# Patient Record
Sex: Male | Born: 1955 | Race: White | Hispanic: No | Marital: Single | Smoking: Former smoker
Health system: Southern US, Community
[De-identification: ages and names within clinical notes are randomized; demographics above are authoritative.]

## PROBLEM LIST (undated history)

## (undated) DIAGNOSIS — I2510 Atherosclerotic heart disease of native coronary artery without angina pectoris: Secondary | ICD-10-CM

## (undated) DIAGNOSIS — J9610 Chronic respiratory failure, unspecified whether with hypoxia or hypercapnia: Secondary | ICD-10-CM

## (undated) DIAGNOSIS — I10 Essential (primary) hypertension: Secondary | ICD-10-CM

## (undated) DIAGNOSIS — R079 Chest pain, unspecified: Secondary | ICD-10-CM

## (undated) DIAGNOSIS — G8929 Other chronic pain: Secondary | ICD-10-CM

## (undated) DIAGNOSIS — E119 Type 2 diabetes mellitus without complications: Secondary | ICD-10-CM

## (undated) HISTORY — PX: SPINE SURGERY: SHX786

---

## 2009-04-19 ENCOUNTER — Inpatient Hospital Stay: Payer: Self-pay | Admitting: Internal Medicine

## 2009-04-29 ENCOUNTER — Ambulatory Visit: Payer: Self-pay | Admitting: Internal Medicine

## 2011-03-14 ENCOUNTER — Other Ambulatory Visit: Payer: Self-pay | Admitting: Neurosurgery

## 2011-03-14 DIAGNOSIS — M549 Dorsalgia, unspecified: Secondary | ICD-10-CM

## 2011-03-21 ENCOUNTER — Ambulatory Visit
Admission: RE | Admit: 2011-03-21 | Discharge: 2011-03-21 | Disposition: A | Discharge status: Home / Self Care | Payer: Commercial Indemnity | Source: Ambulatory Visit | Attending: Neurosurgery | Admitting: Neurosurgery

## 2011-03-21 DIAGNOSIS — M549 Dorsalgia, unspecified: Secondary | ICD-10-CM

## 2011-11-16 ENCOUNTER — Emergency Department: Payer: Self-pay | Admitting: Emergency Medicine

## 2011-11-16 LAB — CBC
MCH: 31.2 pg (ref 26.0–34.0)
MCHC: 34.3 g/dL (ref 32.0–36.0)
MCV: 91 fL (ref 80–100)
Platelet: 200 10*3/uL (ref 150–440)
RDW: 15.2 % — ABNORMAL HIGH (ref 11.5–14.5)

## 2011-11-16 LAB — COMPREHENSIVE METABOLIC PANEL
Albumin: 3.8 g/dL (ref 3.4–5.0)
Anion Gap: 8 (ref 7–16)
Calcium, Total: 9 mg/dL (ref 8.5–10.1)
Chloride: 102 mmol/L (ref 98–107)
Glucose: 185 mg/dL — ABNORMAL HIGH (ref 65–99)
Potassium: 4.1 mmol/L (ref 3.5–5.1)
SGOT(AST): 54 U/L — ABNORMAL HIGH (ref 15–37)
SGPT (ALT): 60 U/L (ref 12–78)
Total Protein: 7.5 g/dL (ref 6.4–8.2)

## 2013-08-05 ENCOUNTER — Other Ambulatory Visit: Payer: Self-pay | Admitting: Family Medicine

## 2013-08-05 ENCOUNTER — Ambulatory Visit
Admission: RE | Admit: 2013-08-05 | Discharge: 2013-08-05 | Disposition: A | Discharge status: Home / Self Care | Payer: PRIVATE HEALTH INSURANCE | Source: Ambulatory Visit | Attending: Family Medicine | Admitting: Family Medicine

## 2013-08-05 DIAGNOSIS — R062 Wheezing: Secondary | ICD-10-CM

## 2018-10-10 ENCOUNTER — Other Ambulatory Visit: Payer: Self-pay | Admitting: Neurosurgery

## 2018-10-10 DIAGNOSIS — M961 Postlaminectomy syndrome, not elsewhere classified: Secondary | ICD-10-CM

## 2018-11-15 ENCOUNTER — Ambulatory Visit
Admission: RE | Admit: 2018-11-15 | Discharge: 2018-11-15 | Disposition: A | Discharge status: Home / Self Care | Payer: 59 | Source: Ambulatory Visit | Attending: Neurosurgery | Admitting: Neurosurgery

## 2018-11-15 DIAGNOSIS — M961 Postlaminectomy syndrome, not elsewhere classified: Secondary | ICD-10-CM

## 2020-03-09 ENCOUNTER — Other Ambulatory Visit: Payer: Self-pay

## 2020-03-09 ENCOUNTER — Emergency Department
Admission: EM | Admit: 2020-03-09 | Discharge: 2020-03-09 | Disposition: A | Discharge status: Home / Self Care | Payer: Commercial Managed Care - PPO | Attending: Emergency Medicine | Admitting: Emergency Medicine

## 2020-03-09 ENCOUNTER — Emergency Department: Payer: Commercial Managed Care - PPO

## 2020-03-09 DIAGNOSIS — M545 Low back pain, unspecified: Secondary | ICD-10-CM | POA: Diagnosis present

## 2020-03-09 DIAGNOSIS — M5441 Lumbago with sciatica, right side: Secondary | ICD-10-CM | POA: Diagnosis not present

## 2020-03-09 DIAGNOSIS — M5431 Sciatica, right side: Secondary | ICD-10-CM

## 2020-03-09 MED ORDER — KETOROLAC TROMETHAMINE 30 MG/ML IJ SOLN
30.0000 mg | Freq: Once | INTRAMUSCULAR | Status: AC
  Administered 2020-03-09: 30 mg via INTRAMUSCULAR
  Filled 2020-03-09: qty 1

## 2020-03-09 MED ORDER — METHOCARBAMOL 500 MG PO TABS: 500.0000 mg | ORAL_TABLET | Freq: Four times a day (QID) | ORAL | 0 refills | Status: DC

## 2020-03-09 MED ORDER — ORPHENADRINE CITRATE 30 MG/ML IJ SOLN
60.0000 mg | Freq: Once | INTRAMUSCULAR | Status: AC
  Administered 2020-03-09: 60 mg via INTRAMUSCULAR
  Filled 2020-03-09: qty 2

## 2020-03-09 MED ORDER — MELOXICAM 15 MG PO TABS: 15.0000 mg | ORAL_TABLET | Freq: Every day | ORAL | 0 refills | Status: DC

## 2020-03-09 NOTE — ED Triage Notes (Signed)
C/O low back pain.  Pain radiating down right leg

## 2020-03-09 NOTE — ED Provider Notes (Signed)
[facility] Emergency Department Provider Note  ____________________________________________  Time seen: Approximately 3:32 PM  I have reviewed the triage vital signs and the nursing notes.   HISTORY  Chief Complaint Back Pain    HPI Gregg Williams is a 65 y.o. male who presents the emergency department with increased lower back pain.  Patient has a long history of back pain, takes Percocet for same.  Patient states that around Christmas or 2 weeks ago he started to have increased lower back pain.  Today was the most severe this morning, and pain was radiating down the right leg.   Patient has had no direct trauma to the back.  No urinary or GI complaints.  Patient states that he is also experiencing some numbness along the right leg as well.  No bowel or bladder dysfunction, saddle anesthesia or paresthesias.        History reviewed. No pertinent past medical history.  There are no problems to display for this patient.   History reviewed. No pertinent surgical history.  Prior to Admission medications   Medication Sig Start Date End Date Taking? Authorizing Provider  meloxicam (MOBIC) 15 MG tablet Take 1 tablet (15 mg total) by mouth daily. 03/09/20  Yes Leeona Mccardle, Charline Bills, PA-C  methocarbamol (ROBAXIN) 500 MG tablet Take 1 tablet (500 mg total) by mouth 4 (four) times daily. 03/09/20  Yes Kortlynn Poust, Charline Bills, PA-C    Allergies Patient has no allergy information on record.  No family history on file.  Social History     Review of Systems  Constitutional: No fever/chills Eyes: No visual changes. No discharge ENT: No upper respiratory complaints. Cardiovascular: no chest pain. Respiratory: no cough. No SOB. Gastrointestinal: No abdominal pain.  No nausea, no vomiting.  No diarrhea.  No constipation. Genitourinary: Negative for dysuria. No hematuria Musculoskeletal: Positive for lower back pain with pain radiating down the right leg Skin:  Negative for rash, abrasions, lacerations, ecchymosis. Neurological: Negative for headaches, focal weakness or numbness.  10 System ROS otherwise negative.  ____________________________________________   PHYSICAL EXAM:  VITAL SIGNS: ED Triage Vitals  Enc Vitals Group     BP 03/09/20 1405 (!) 155/57     Pulse Rate 03/09/20 1405 85     Resp 03/09/20 1405 19     Temp 03/09/20 1405 98.9 F (37.2 C)     Temp src --      SpO2 03/09/20 1405 90 %     Weight --      Height --      Head Circumference --      Peak Flow --      Pain Score 03/09/20 1404 3     Pain Loc --      Pain Edu? --      Excl. in [place]? --      Constitutional: Alert and oriented. Well appearing and in no acute distress. Eyes: Conjunctivae are normal. PERRL. EOMI. Head: Atraumatic. ENT:      Ears:       Nose: No congestion/rhinnorhea.      Mouth/Throat: Mucous membranes are moist.  Neck: No stridor.    Cardiovascular: Normal rate, regular rhythm. Normal S1 and S2.  Good peripheral circulation. Respiratory: Normal respiratory effort without tachypnea or retractions. Lungs CTAB. Good air entry to the bases with no decreased or absent breath sounds. Gastrointestinal: Bowel sounds 4 quadrants. Soft and nontender to palpation. No guarding or rigidity. No palpable masses. No distention. No CVA tenderness. Musculoskeletal: Full  range of motion to all extremities. No gross deformities appreciated.  No obvious deformity to the lumbar spine.  No gross signs of trauma.  Diffuse tenderness to the lumbar region with no extension into the SI joint or sciatic notch.  Patient does have some mild decrease sensation along the lateral thigh when compared with left extremity.  No loss of range of motion to either extremity.  Pulses intact distally.  Sensation grossly intact and equal with the exception of area listed above. Neurologic:  Normal speech and language. No gross focal neurologic deficits are appreciated.  Skin:  Skin is  warm, dry and intact. No rash noted. Psychiatric: Mood and affect are normal. Speech and behavior are normal. Patient exhibits appropriate insight and judgement.   ____________________________________________   LABS (all labs ordered are listed, but only abnormal results are displayed)  Labs Reviewed - No data to display ____________________________________________  EKG   ____________________________________________  RADIOLOGY I personally viewed and evaluated these images as part of my medical decision making, as well as reviewing the written report by the radiologist.  ED Provider Interpretation: Multilevel degenerative changes without acute findings  DG Lumbar Spine 2-3 Views  Result Date: 03/09/2020 CLINICAL DATA:  Acute on chronic back pain. History of lumbar surgery. EXAM: LUMBAR SPINE - 2-3 VIEW COMPARISON:  MRI lumbar spine 11/15/2018. Lumbar spine series 03/09/2011. FINDINGS: Diffuse multilevel degenerative changes lumbar spine. No acute bony abnormality identified. Normal alignment. Aortoiliac atherosclerotic vascular calcification. IMPRESSION: 1. Diffuse multilevel degenerative changes lumbar spine. No acute abnormality. 2. Aortoiliac atherosclerotic vascular disease. Electronically Signed   By: Marcello Moores  Register   On: 03/09/2020 16:55    ____________________________________________    PROCEDURES  Procedure(s) performed:    Procedures    Medications  ketorolac (TORADOL) 30 MG/ML injection 30 mg (has no administration in time range)  orphenadrine (NORFLEX) injection 60 mg (has no administration in time range)     ____________________________________________   INITIAL IMPRESSION / ASSESSMENT AND PLAN / ED COURSE  Pertinent labs & imaging results that were available during my care of the patient were reviewed by me and considered in my medical decision making (see chart for details).  Review of the [place] CSRS was performed in accordance of the [place] prior to  dispensing any controlled drugs.           Patient's diagnosis is consistent with lower back pain with sciatica.  Patient presented to the emergency department with increased lower back pain.  History of chronic back pain and patient is on pain management for same.  No direct trauma to the area.  No concerning neuro symptoms.  Patient with reassuring x-ray.  At this time patient will be treated with anti-inflammatory muscle relaxer with follow-up with pain management and neurosurgery.  No indication for further work-up currently.. Patient is given ED precautions to return to the ED for any worsening or new symptoms.     ____________________________________________  FINAL CLINICAL IMPRESSION(S) / ED DIAGNOSES  Final diagnoses:  Sciatica of right side      NEW MEDICATIONS STARTED DURING THIS VISIT:  ED Discharge Orders         Ordered    meloxicam (MOBIC) 15 MG tablet  Daily        03/09/20 1744    methocarbamol (ROBAXIN) 500 MG tablet  4 times daily        03/09/20 1744              This chart was dictated using voice recognition  software/Dragon. Despite best efforts to proofread, errors can occur which can change the meaning. Any change was purely unintentional.    Darletta Moll, PA-C 03/09/20 1745    Nance Pear, MD 03/09/20 1816

## 2020-07-16 ENCOUNTER — Observation Stay
Admission: EM | Admit: 2020-07-16 | Discharge: 2020-07-17 | Disposition: A | Discharge status: Home / Self Care | Payer: Commercial Managed Care - PPO | Attending: Internal Medicine | Admitting: Internal Medicine

## 2020-07-16 ENCOUNTER — Observation Stay: Admit: 2020-07-16 | Payer: Commercial Managed Care - PPO

## 2020-07-16 ENCOUNTER — Emergency Department: Payer: Commercial Managed Care - PPO

## 2020-07-16 ENCOUNTER — Other Ambulatory Visit: Payer: Self-pay

## 2020-07-16 ENCOUNTER — Encounter: Payer: Self-pay | Admitting: Emergency Medicine

## 2020-07-16 DIAGNOSIS — Z7984 Long term (current) use of oral hypoglycemic drugs: Secondary | ICD-10-CM | POA: Insufficient documentation

## 2020-07-16 DIAGNOSIS — E119 Type 2 diabetes mellitus without complications: Secondary | ICD-10-CM

## 2020-07-16 DIAGNOSIS — Z20822 Contact with and (suspected) exposure to covid-19: Secondary | ICD-10-CM | POA: Insufficient documentation

## 2020-07-16 DIAGNOSIS — R079 Chest pain, unspecified: Secondary | ICD-10-CM | POA: Diagnosis present

## 2020-07-16 DIAGNOSIS — I700 Atherosclerosis of aorta: Secondary | ICD-10-CM | POA: Insufficient documentation

## 2020-07-16 DIAGNOSIS — Z87891 Personal history of nicotine dependence: Secondary | ICD-10-CM | POA: Diagnosis not present

## 2020-07-16 DIAGNOSIS — J449 Chronic obstructive pulmonary disease, unspecified: Secondary | ICD-10-CM | POA: Diagnosis not present

## 2020-07-16 DIAGNOSIS — R0789 Other chest pain: Secondary | ICD-10-CM

## 2020-07-16 DIAGNOSIS — J9600 Acute respiratory failure, unspecified whether with hypoxia or hypercapnia: Principal | ICD-10-CM | POA: Insufficient documentation

## 2020-07-16 DIAGNOSIS — Z79899 Other long term (current) drug therapy: Secondary | ICD-10-CM | POA: Insufficient documentation

## 2020-07-16 DIAGNOSIS — D696 Thrombocytopenia, unspecified: Secondary | ICD-10-CM | POA: Diagnosis not present

## 2020-07-16 DIAGNOSIS — I2510 Atherosclerotic heart disease of native coronary artery without angina pectoris: Secondary | ICD-10-CM | POA: Insufficient documentation

## 2020-07-16 DIAGNOSIS — I272 Pulmonary hypertension, unspecified: Secondary | ICD-10-CM | POA: Diagnosis not present

## 2020-07-16 DIAGNOSIS — E669 Obesity, unspecified: Secondary | ICD-10-CM

## 2020-07-16 HISTORY — DX: Type 2 diabetes mellitus without complications: E11.9

## 2020-07-16 LAB — COMPREHENSIVE METABOLIC PANEL
ALT: 20 U/L (ref 0–44)
AST: 20 U/L (ref 15–41)
Albumin: 3.4 g/dL — ABNORMAL LOW (ref 3.5–5.0)
Alkaline Phosphatase: 68 U/L (ref 38–126)
Anion gap: 11 (ref 5–15)
BUN: 13 mg/dL (ref 8–23)
CO2: 30 mmol/L (ref 22–32)
Calcium: 8.7 mg/dL — ABNORMAL LOW (ref 8.9–10.3)
Chloride: 96 mmol/L — ABNORMAL LOW (ref 98–111)
Creatinine, Ser: 0.61 mg/dL (ref 0.61–1.24)
GFR, Estimated: >60 mL/min (ref >60)
Glucose, Bld: 96 mg/dL (ref 70–99)
Potassium: 3.9 mmol/L (ref 3.5–5.1)
Sodium: 137 mmol/L (ref 135–145)
Total Bilirubin: 0.9 mg/dL (ref 0.3–1.2)
Total Protein: 7.5 g/dL (ref 6.5–8.1)

## 2020-07-16 LAB — CBC
HCT: 49.1 % (ref 39.0–52.0)
Hemoglobin: 16.6 g/dL (ref 13.0–17.0)
MCH: 30.6 pg (ref 26.0–34.0)
MCHC: 33.8 g/dL (ref 30.0–36.0)
MCV: 90.4 fL (ref 80.0–100.0)
Platelets: 100 10*3/uL — ABNORMAL LOW (ref 150–400)
RBC: 5.43 MIL/uL (ref 4.22–5.81)
RDW: 14.5 % (ref 11.5–15.5)
WBC: 4.5 10*3/uL (ref 4.0–10.5)
nRBC: 0 % (ref 0.0–0.2)

## 2020-07-16 LAB — D-DIMER, QUANTITATIVE: D-Dimer, Quant: 0.88 ug/mL-FEU — ABNORMAL HIGH (ref 0.00–0.50)

## 2020-07-16 LAB — HEMOGLOBIN A1C
Hgb A1c MFr Bld: 7.1 % — ABNORMAL HIGH (ref 4.8–5.6)
Mean Plasma Glucose: 157.07 mg/dL

## 2020-07-16 LAB — TROPONIN I (HIGH SENSITIVITY)
Troponin I (High Sensitivity): 8 ng/L (ref <18)
Troponin I (High Sensitivity): 7 ng/L (ref <18)

## 2020-07-16 LAB — GLUCOSE, CAPILLARY
Glucose-Capillary: 171 mg/dL — ABNORMAL HIGH (ref 70–99)
Glucose-Capillary: 151 mg/dL — ABNORMAL HIGH (ref 70–99)

## 2020-07-16 LAB — SARS CORONAVIRUS 2 (TAT 6-24 HRS): SARS Coronavirus 2: NEGATIVE

## 2020-07-16 MED ORDER — ONDANSETRON HCL 4 MG PO TABS: 4.0000 mg | ORAL_TABLET | Freq: Four times a day (QID) | ORAL | Status: DC | PRN

## 2020-07-16 MED ORDER — ACETAMINOPHEN 325 MG PO TABS: 650.0000 mg | ORAL_TABLET | Freq: Four times a day (QID) | ORAL | Status: DC | PRN

## 2020-07-16 MED ORDER — OXYCODONE HCL 5 MG PO TABS
5.0000 mg | ORAL_TABLET | Freq: Four times a day (QID) | ORAL | Status: DC | PRN
  Administered 2020-07-16 – 2020-07-17 (×2): 5 mg via ORAL
  Filled 2020-07-16 (×2): qty 1

## 2020-07-16 MED ORDER — ACETAMINOPHEN 650 MG RE SUPP: 650.0000 mg | Freq: Four times a day (QID) | RECTAL | Status: DC | PRN

## 2020-07-16 MED ORDER — OXYCODONE-ACETAMINOPHEN 5-325 MG PO TABS
1.0000 | ORAL_TABLET | Freq: Four times a day (QID) | ORAL | Status: DC | PRN
Start: 2020-07-16 — End: 2020-07-17
  Administered 2020-07-16 – 2020-07-17 (×3): 1 via ORAL
  Filled 2020-07-16 (×4): qty 1

## 2020-07-16 MED ORDER — ASPIRIN EC 81 MG PO TBEC
81.0000 mg | DELAYED_RELEASE_TABLET | Freq: Every day | ORAL | Status: DC
  Administered 2020-07-16 – 2020-07-17 (×2): 81 mg via ORAL
  Filled 2020-07-16 (×2): qty 1

## 2020-07-16 MED ORDER — IOHEXOL 350 MG/ML SOLN
75.0000 mL | Freq: Once | INTRAVENOUS | Status: AC | PRN
  Administered 2020-07-16: 75 mL via INTRAVENOUS

## 2020-07-16 MED ORDER — INSULIN ASPART 100 UNIT/ML IJ SOLN: 0.0000 [IU] | Freq: Three times a day (TID) | INTRAMUSCULAR | Status: DC

## 2020-07-16 MED ORDER — ONDANSETRON HCL 4 MG/2ML IJ SOLN: 4.0000 mg | Freq: Four times a day (QID) | INTRAMUSCULAR | Status: DC | PRN

## 2020-07-16 MED ORDER — OXYCODONE-ACETAMINOPHEN 10-325 MG PO TABS: 1.0000 | ORAL_TABLET | Freq: Four times a day (QID) | ORAL | Status: DC | PRN

## 2020-07-16 MED ORDER — SODIUM CHLORIDE 0.9% FLUSH
3.0000 mL | Freq: Two times a day (BID) | INTRAVENOUS | Status: DC
  Administered 2020-07-16 – 2020-07-17 (×2): 3 mL via INTRAVENOUS

## 2020-07-16 MED ORDER — LOSARTAN POTASSIUM 50 MG PO TABS
50.0000 mg | ORAL_TABLET | Freq: Every day | ORAL | Status: DC
  Administered 2020-07-16: 50 mg via ORAL
  Filled 2020-07-16: qty 1

## 2020-07-16 MED ORDER — SODIUM CHLORIDE 0.9% FLUSH
3.0000 mL | INTRAVENOUS | Status: DC | PRN
  Administered 2020-07-16: 21:00:00 3 mL via INTRAVENOUS

## 2020-07-16 MED ORDER — SODIUM CHLORIDE 0.9 % IV SOLN: 250.0000 mL | INTRAVENOUS | Status: DC | PRN

## 2020-07-16 NOTE — ED Provider Notes (Signed)
[facility] Emergency Department Provider Note   ____________________________________________    I have reviewed the triage vital signs and the nursing notes.   HISTORY  Chief Complaint Chest Pain     HPI Gregg Williams is a 65 y.o. male with a history of diabetes who presents with complaints of chest pain.  Patient describes symptoms started several days ago, initially he had discomfort in his right chest, now he feels more left anterior chest wall discomfort, he states that it is tender to palpation there.  He denies cough, fevers or chills.  No calf pain or swelling.  No history of blood clots.  No lower extremity edema.  No diaphoresis nausea or vomiting  Past Medical History:  Diagnosis Date  . Diabetes mellitus without complication [facility])     Patient Active Problem List   Diagnosis Date Noted  . Acute respiratory failure ([place]) 07/16/2020    Past Surgical History:  Procedure Laterality Date  . SPINE SURGERY      Prior to Admission medications   Medication Sig Start Date End Date Taking? Authorizing Provider  meloxicam (MOBIC) 15 MG tablet Take 1 tablet (15 mg total) by mouth daily. 03/09/20   Cuthriell, Charline Bills, PA-C  methocarbamol (ROBAXIN) 500 MG tablet Take 1 tablet (500 mg total) by mouth 4 (four) times daily. 03/09/20   Cuthriell, Charline Bills, PA-C     Allergies Benadryl [diphenhydramine]  No family history on file.  Social History Social History   Tobacco Use  . Smoking status: Former Research scientist (life sciences)  . Smokeless tobacco: Never Used  Substance Use Topics  . Alcohol use: Not Currently  . Drug use: Not Currently    Review of Systems  Constitutional: No fever/chills Eyes: No visual changes.  ENT: No sore throat. Cardiovascular: As above Respiratory: Denies shortness of breath. Gastrointestinal: No abdominal pain.  No nausea, no vomiting.   Genitourinary: Negative for dysuria. Musculoskeletal: Negative for back pain. Skin:  Negative for rash. Neurological: Negative for headaches or weakness   ____________________________________________   PHYSICAL EXAM:  VITAL SIGNS: ED Triage Vitals  Enc Vitals Group     BP 07/16/20 0852 (!) 176/73     Pulse Rate 07/16/20 0852 85     Resp 07/16/20 0852 17     Temp 07/16/20 0852 98 F (36.7 C)     Temp Source 07/16/20 0852 Oral     SpO2 07/16/20 0852 (!) 88 %     Weight 07/16/20 0850 124.3 kg (274 lb)     Height 07/16/20 0850 1.803 m (5\' 11" )     Head Circumference --      Peak Flow --      Pain Score 07/16/20 0849 3     Pain Loc --      Pain Edu? --      Excl. in [place]? --     Constitutional: Alert and oriented.  Eyes: Conjunctivae are normal.  Head: Atraumatic. Nose: No congestion/rhinnorhea. Mouth/Throat: Mucous membranes are moist.   Neck:  Painless ROM Cardiovascular: Normal rate, regular rhythm. Grossly normal heart sounds.  Good peripheral circulation.  Patient with superior anterior left-sided sternal discomfort, tenderness to palpation, no rash bruising or abnormality Respiratory: Normal respiratory effort.  No retractions. Lungs CTAB. Gastrointestinal: Soft and nontender. No distention.  No CVA tenderness.  Musculoskeletal: No lower extremity tenderness nor edema.  Warm and well perfused Neurologic:  Normal speech and language. No gross focal neurologic deficits are appreciated.  Skin:  Skin is  warm, dry and intact. No rash noted. Psychiatric: Mood and affect are normal. Speech and behavior are normal.  ____________________________________________   LABS (all labs ordered are listed, but only abnormal results are displayed)  Labs Reviewed  CBC - Abnormal; Notable for the following components:      Result Value   Platelets 100 (*)    All other components within normal limits  COMPREHENSIVE METABOLIC PANEL - Abnormal; Notable for the following components:   Chloride 96 (*)    Calcium 8.7 (*)    Albumin 3.4 (*)    All other components within  normal limits  D-DIMER, QUANTITATIVE - Abnormal; Notable for the following components:   D-Dimer, Quant 0.88 (*)    All other components within normal limits  TROPONIN I (HIGH SENSITIVITY)  TROPONIN I (HIGH SENSITIVITY)   ____________________________________________  EKG  ED ECG REPORT I, Lavonia Drafts, the attending physician, personally viewed and interpreted this ECG.  Date: 07/16/2020  Rhythm: normal sinus rhythm QRS Axis: normal Intervals: normal ST/T Wave abnormalities: Nonspecific change Narrative Interpretation: PVC noted  ____________________________________________  RADIOLOGY  Chest x-ray read by me, question nodule ____________________________________________   PROCEDURES  Procedure(s) performed: No  Procedures   Critical Care performed: No ____________________________________________   INITIAL IMPRESSION / ASSESSMENT AND PLAN / ED COURSE  Pertinent labs & imaging results that were available during my care of the patient were reviewed by me and considered in my medical decision making (see chart for details).  Patient presents with chest discomfort as described above, appears almost as chest wall pain however the patient is requiring 2 L nasal cannula, has worsening pain with deep inspiration  Chest x-ray with unusual nodule noted, radiologist recommending CT chest without contrast however patient's D-dimer is elevated, will send for CT angio chest  CT angio negative for PE.  No nodule noted on CT scan.  Concerning for pulmonary hypertension however.  Patient dropped to 87% with ambulation on room air, he requires nasal cannula oxygen.  Discussed with hospitalist for admission for likely pulmonary hypertension causing hypoxia    ____________________________________________   FINAL CLINICAL IMPRESSION(S) / ED DIAGNOSES  Final diagnoses:  Pulmonary hypertension ([place])        Note:  This document was prepared using Dragon voice recognition  software and may include unintentional dictation errors.   Lavonia Drafts, MD 07/16/20 1250

## 2020-07-16 NOTE — ED Notes (Signed)
PT ambulatory in hallway, RA sats noted to drop to 87% and pt endorsed increased SHOB.

## 2020-07-16 NOTE — Plan of Care (Signed)

## 2020-07-16 NOTE — H&P (Signed)
History and Physical    Gregg Williams [id number] DOB: Jul 22, 1955 DOA: 07/16/2020  PCP: Leonard Downing, MD   Patient coming from: Home  I have personally briefly reviewed patient's old medical records in [place]  Chief Complaint: Chest pain  HPI: Gregg Williams is a 65 y.o. male with medical history significant for type 2 diabetes mellitus, morbid obesity, gout who presents to the ER via private vehicle for evaluation of chest pain. Patient states that his symptoms started about a week ago with pain initially in his upper back with radiation to the right chest and then involving the left anterior chest wall for the last 2 days.  There has been no radiation of the pain and he denies having any associated nausea, vomiting, shortness of breath or palpitations.  Chest pain has been associated with diaphoresis. He has taken Alka-Seltzer at home with some relief in his pain. He was noted to have room air pulse oximetry of 88% and was placed 2 L of oxygen with improvement symmetric to 95%. He denies having any abdominal pain, no changes in his bowel habits, no dizziness, no lightheadedness, no headache, no fever, no chills, no cough, no urinary symptoms, no blurred vision, no focal deficits. Labs show sodium 137, potassium 3.9, chloride 96, bicarb 30, glucose 96, BUN 13, creatinine 0.61, calcium 8.7, alkaline phosphatase 68, albumin 3.4, AST 20, ALT 20, total protein 7.5, troponin 8, white count 4.5, hemoglobin 16.6, hematocrit 49.1, MCV 90.4, RDW 14.5, platelet count 100, D-dimer 0.88 His point-of-care SARS coronavirus 2 test is pending Chest x-ray reviewed by me shows new nodular density projects over the right inferior hilar region. CT chest without contrast recommended to further evaluate. Hyperexpansion with chronic interstitial coarsening. CT angiogram of the chest is negative for acute PE or thoracic aortic dissection. Dilated central pulmonary arteries suggesting  pulmonary hypertension. Coronary and Aortic Atherosclerosis  Borderline enlarged aortopulmonary window lymph node. Right pleural plaques Twelve-lead EKG reviewed by me shows sinus rhythm with PVCs and T wave inversion in the anterior leads.    ED Course: Patient is a 65 year old male who presents to the ER for evaluation of chest pain mostly over the left anterior chest wall for 2 days.  Pain is not related to rest or exertion and is associated with diaphoresis.  He has no EKG changes and troponin is negative.  CT angiogram is negative for PE but shows pulmonary hypertension.  Patient was hypoxic, with room air pulse oximetry of 88% and is improved with oxygen supplementation at 2 L to 95%. He will be referred to observation status for further evaluation.   Review of Systems: As per HPI otherwise all other systems reviewed and negative.    Past Medical History:  Diagnosis Date  . Diabetes mellitus without complication [facility])     Past Surgical History:  Procedure Laterality Date  . SPINE SURGERY       reports that he has quit smoking. He has never used smokeless tobacco. He reports previous alcohol use. He reports previous drug use.  Allergies  Allergen Reactions  . Benadryl [Diphenhydramine] Hives    Family History  Problem Relation Age of Onset  . Other Father   . CAD Father       Prior to Admission medications   Medication Sig Start Date End Date Taking? Authorizing Provider  glimepiride (AMARYL) 4 MG tablet Take 8 mg by mouth daily. 04/29/20  Yes [provider]  indomethacin (INDOCIN) 25 MG capsule Take  25 mg by mouth at bedtime.   Yes [provider]  JARDIANCE 10 MG TABS tablet Take 10 mg by mouth every morning. 07/07/20  Yes [provider]  losartan (COZAAR) 100 MG tablet Take 0.5 tablets by mouth at bedtime. 04/04/20  Yes [provider]  oxyCODONE-acetaminophen (PERCOCET) 10-325 MG tablet Take 1 tablet by mouth every 5 (five) hours  as needed. 07/01/20  Yes [provider]  meloxicam (MOBIC) 15 MG tablet Take 1 tablet (15 mg total) by mouth daily. Patient not taking: Reported on 07/16/2020 03/09/20   Cuthriell, Charline Bills, PA-C  methocarbamol (ROBAXIN) 500 MG tablet Take 1 tablet (500 mg total) by mouth 4 (four) times daily. Patient not taking: Reported on 07/16/2020 03/09/20   Brynda Peon    Physical Exam: Vitals:   07/16/20 1007 07/16/20 1030 07/16/20 1100 07/16/20 1130  BP: (!) 150/67 (!) 156/62 (!) 149/69 (!) 151/67  Pulse: 81 73 74 73  Resp: 14 (!) 25  15  Temp:      TempSrc:      SpO2: 93% 93% 96% 95%  Weight:      Height:         Vitals:   07/16/20 1007 07/16/20 1030 07/16/20 1100 07/16/20 1130  BP: (!) 150/67 (!) 156/62 (!) 149/69 (!) 151/67  Pulse: 81 73 74 73  Resp: 14 (!) 25  15  Temp:      TempSrc:      SpO2: 93% 93% 96% 95%  Weight:      Height:          Constitutional: Alert and oriented x 3 . Not in any apparent distress.  HEENT:      Head: Normocephalic and atraumatic.         Eyes: PERLA, EOMI, Conjunctivae are normal. Sclera is non-icteric.       Mouth/Throat: Mucous membranes are moist.       Neck: Supple with no signs of meningismus. Cardiovascular: Regular rate and rhythm. No murmurs, gallops, or rubs. 2+ symmetrical distal pulses are present . No JVD. No LE edema Respiratory: Respiratory effort normal .Lungs sounds clear bilaterally. No wheezes, crackles, or rhonchi.  Gastrointestinal: Soft, non tender, and non distended with positive bowel sounds.  Central adiposity Genitourinary: No CVA tenderness. Musculoskeletal: Nontender with normal range of motion in all extremities. No cyanosis, or erythema of extremities. Neurologic:  Face is symmetric. Moving all extremities. No gross focal neurologic deficits . Skin: Skin is warm, dry.  No rash or ulcers.  Ecchymosis on his upper extremities Psychiatric: Mood and affect are normal   Labs on Admission: I have  personally reviewed following labs and imaging studies  CBC: Recent Labs  Lab 07/16/20 0946  WBC 4.5  HGB 16.6  HCT 49.1  MCV 90.4  PLT 629*   Basic Metabolic Panel: Recent Labs  Lab 07/16/20 0946  NA 137  K 3.9  CL 96*  CO2 30  GLUCOSE 96  BUN 13  CREATININE 0.61  CALCIUM 8.7*   GFR: Estimated Creatinine Clearance: 128.5 mL/min (by C-G formula based on SCr of 0.61 mg/dL). Liver Function Tests: Recent Labs  Lab 07/16/20 0946  AST 20  ALT 20  ALKPHOS 68  BILITOT 0.9  PROT 7.5  ALBUMIN 3.4*   No results for input(s): LIPASE, AMYLASE in the last 168 hours. No results for input(s): AMMONIA in the last 168 hours. Coagulation Profile: No results for input(s): INR, PROTIME in the last 168 hours. Cardiac Enzymes:  No results for input(s): CKTOTAL, CKMB, CKMBINDEX, TROPONINI in the last 168 hours. BNP (last 3 results) No results for input(s): PROBNP in the last 8760 hours. HbA1C: No results for input(s): HGBA1C in the last 72 hours. CBG: No results for input(s): GLUCAP in the last 168 hours. Lipid Profile: No results for input(s): CHOL, HDL, LDLCALC, TRIG, CHOLHDL, LDLDIRECT in the last 72 hours. Thyroid Function Tests: No results for input(s): TSH, T4TOTAL, FREET4, T3FREE, THYROIDAB in the last 72 hours. Anemia Panel: No results for input(s): VITAMINB12, FOLATE, FERRITIN, TIBC, IRON, RETICCTPCT in the last 72 hours. Urine analysis: No results found for: COLORURINE, APPEARANCEUR, [place], [place], GLUCOSEU, [place], BILIRUBINUR, KETONESUR, PROTEINUR, UROBILINOGEN, NITRITE, LEUKOCYTESUR  Radiological Exams on Admission: DG Chest 2 View  Result Date: 07/16/2020 CLINICAL DATA:  Chest pain EXAM: CHEST - 2 VIEW COMPARISON:  08/05/2013 FINDINGS: Cardiopericardial silhouette is at upper limits of normal for size. There is pulmonary vascular congestion without overt pulmonary edema. Similar hyperexpansion with stable appearance of right pleural thickening/scarring. New  nodular density projects over the right inferior hilar region. Telemetry leads overlie the chest. IMPRESSION: 1. New nodular density projects over the right inferior hilar region. CT chest without contrast recommended to further evaluate. 2. Hyperexpansion with chronic interstitial coarsening. Electronically Signed   By: Misty Stanley M.D.   On: 07/16/2020 09:35   CT Angio Chest PE W and/or Wo Contrast  Result Date: 07/16/2020 CLINICAL DATA:  Right back pain, chest pain, reproducible with palpation; considered high prob for PE EXAM: CT ANGIOGRAPHY CHEST WITH CONTRAST TECHNIQUE: Multidetector CT imaging of the chest was performed using the standard protocol during bolus administration of intravenous contrast. Multiplanar CT image reconstructions and MIPs were obtained to evaluate the vascular anatomy. CONTRAST:  5mL OMNIPAQUE IOHEXOL 350 MG/ML SOLN COMPARISON:  None. FINDINGS: Cardiovascular: Heart size upper limits normal. No pericardial effusion. Dilated central pulmonary arteries. Satisfactory opacification of pulmonary arteries noted, and there is no evidence of pulmonary emboli. LAD coronary calcifications. Adequate contrast opacification of the thoracic aorta with no evidence of dissection, aneurysm, or stenosis. There is classic 3-vessel brachiocephalic arch anatomy without proximal stenosis. Scattered calcified plaque in the arch and descending thoracic aorta. Visualized proximal abdominal aorta mildly atheromatous, nondilated. Mediastinum/Nodes: 1.1 cm enlarged AP window lymph node. Additional subcentimeter prominent pre-vascular and right paratracheal nodes. Lungs/Pleura: Partially calcified right plaques. No pleural effusion. No pneumothorax.Pleuroparenchymal scarring at the right lung apex. Subpleural blebs in both upper lobes. Linear scarring or subsegmental atelectasis posteriorly at the right lung base. Upper Abdomen: No acute findings. Musculoskeletal: No chest wall abnormality. No acute or  significant osseous findings. Review of the MIP images confirms the above findings. IMPRESSION: 1. Negative for acute PE or thoracic aortic dissection. 2. Dilated central pulmonary arteries suggesting pulmonary hypertension. 3. Coronary and Aortic Atherosclerosis (ICD10-170.0). 4. Borderline enlarged aortopulmonary window lymph node. 5. Right pleural plaques Electronically Signed   By: Lucrezia Europe M.D.   On: 07/16/2020 11:14     Assessment/Plan Principal Problem:   Acute respiratory failure (HCC) Active Problems:   Chest pain   Diabetes mellitus without complication (HCC)   Obesity (BMI 30-39.9)   Thrombocytopenia ([place])       Acute respiratory failure Unclear etiology Patient was noted to have room air pulse oximetry of 88% and with ambulation he dropped to 85%. He is currently on 2 L of oxygen with improvement in his pulse oximetry to 95% Respiratory failure may be secondary to COPD as well as obesity hypoventilation syndrome CT angiogram  shows pulmonary hypertension We will obtain 2D echocardiogram to assess LVEF and check pulmonary pressures Patient will need to be assessed for home oxygen need prior to discharge     Chest pain ??  Atypical Patient's risk factors include male sex, diabetes mellitus and hypertension He has ruled out for an acute coronary syndrome will likely benefit from stress testing for further risk factor stratification We will consult cardiology    Diabetes mellitus Maintain consistent carbohydrate diet Glycemic control with sliding scale insulin Hold oral hypoglycemic agent    Hypertension Continue losartan    Thrombocytopenia Unclear etiology Patient has bruises on his upper extremities No active bleed Monitor platelet count closely  DVT prophylaxis: SCD Code Status: full code Family Communication: Greater than 50% of time was spent discussing patient's condition and plan of care with him and his son at the bedside.  All questions and  concerns have been addressed.  They verbalized understanding and agree with the plan. Disposition Plan: Back to previous home environment Consults called: Cardiology Status: Observation    Jennife Zaucha MD [facility]     07/16/2020, 1:43 PM

## 2020-07-16 NOTE — ED Triage Notes (Signed)
Pt to ED via POV. Pt states last Thursday had sudden onset upper back pain to the R side, then pain moved to R chest, then moved to L chest then to his jaw. Pt c/o L sided chest pain that is reproducible with palpation and radiates down his L arm. Pt states has been taking alkaseltzer that has been relieving the pain.   Pt states is also a pain management patient and has been taking his percocet with some relief.    Pt's RA sat on arrival 88%

## 2020-07-17 ENCOUNTER — Observation Stay (HOSPITAL_BASED_OUTPATIENT_CLINIC_OR_DEPARTMENT_OTHER): Payer: Commercial Managed Care - PPO

## 2020-07-17 ENCOUNTER — Observation Stay (HOSPITAL_BASED_OUTPATIENT_CLINIC_OR_DEPARTMENT_OTHER)
Admit: 2020-07-17 | Discharge: 2020-07-17 | Disposition: A | Discharge status: Home / Self Care | Payer: Commercial Managed Care - PPO | Attending: Internal Medicine | Admitting: Internal Medicine

## 2020-07-17 DIAGNOSIS — R079 Chest pain, unspecified: Secondary | ICD-10-CM | POA: Diagnosis not present

## 2020-07-17 DIAGNOSIS — D696 Thrombocytopenia, unspecified: Secondary | ICD-10-CM | POA: Diagnosis not present

## 2020-07-17 DIAGNOSIS — I272 Pulmonary hypertension, unspecified: Secondary | ICD-10-CM

## 2020-07-17 DIAGNOSIS — J9600 Acute respiratory failure, unspecified whether with hypoxia or hypercapnia: Secondary | ICD-10-CM | POA: Diagnosis not present

## 2020-07-17 DIAGNOSIS — I2510 Atherosclerotic heart disease of native coronary artery without angina pectoris: Secondary | ICD-10-CM

## 2020-07-17 DIAGNOSIS — E669 Obesity, unspecified: Secondary | ICD-10-CM | POA: Diagnosis not present

## 2020-07-17 DIAGNOSIS — I700 Atherosclerosis of aorta: Secondary | ICD-10-CM

## 2020-07-17 DIAGNOSIS — R0789 Other chest pain: Secondary | ICD-10-CM | POA: Diagnosis not present

## 2020-07-17 LAB — NM MYOCAR MULTI W/SPECT W/WALL MOTION / EF
LV dias vol: 50 mL (ref 62–150)
LV sys vol: 15 mL
MPHR: 158 {beats}/min
Peak HR: 109 {beats}/min
Percent HR: 68 %
Rest HR: 74 {beats}/min
SDS: 0
SRS: 1
SSS: 0
TID: 0.85

## 2020-07-17 LAB — BASIC METABOLIC PANEL
Anion gap: 11 (ref 5–15)
BUN: 13 mg/dL (ref 8–23)
CO2: 27 mmol/L (ref 22–32)
Calcium: 8.8 mg/dL — ABNORMAL LOW (ref 8.9–10.3)
Chloride: 95 mmol/L — ABNORMAL LOW (ref 98–111)
Creatinine, Ser: 0.56 mg/dL — ABNORMAL LOW (ref 0.61–1.24)
GFR, Estimated: >60 mL/min (ref >60)
Glucose, Bld: 124 mg/dL — ABNORMAL HIGH (ref 70–99)
Potassium: 4.4 mmol/L (ref 3.5–5.1)
Sodium: 133 mmol/L — ABNORMAL LOW (ref 135–145)

## 2020-07-17 LAB — LIPID PANEL
Cholesterol: 127 mg/dL (ref 0–200)
HDL: 33 mg/dL — ABNORMAL LOW (ref >40)
LDL Cholesterol: 71 mg/dL (ref 0–99)
Total CHOL/HDL Ratio: 3.8 RATIO
Triglycerides: 113 mg/dL (ref <150)
VLDL: 23 mg/dL (ref 0–40)

## 2020-07-17 LAB — GLUCOSE, CAPILLARY
Glucose-Capillary: 139 mg/dL — ABNORMAL HIGH (ref 70–99)
Glucose-Capillary: 127 mg/dL — ABNORMAL HIGH (ref 70–99)

## 2020-07-17 LAB — HIV ANTIBODY (ROUTINE TESTING W REFLEX): HIV Screen 4th Generation wRfx: NONREACTIVE

## 2020-07-17 LAB — CBC
HCT: 48 % (ref 39.0–52.0)
Hemoglobin: 16.3 g/dL (ref 13.0–17.0)
MCH: 30.7 pg (ref 26.0–34.0)
MCHC: 34 g/dL (ref 30.0–36.0)
MCV: 90.4 fL (ref 80.0–100.0)
Platelets: 104 10*3/uL — ABNORMAL LOW (ref 150–400)
RBC: 5.31 MIL/uL (ref 4.22–5.81)
RDW: 14.3 % (ref 11.5–15.5)
WBC: 4.2 10*3/uL (ref 4.0–10.5)
nRBC: 0 % (ref 0.0–0.2)

## 2020-07-17 MED ORDER — ROSUVASTATIN CALCIUM 20 MG PO TABS
20.0000 mg | ORAL_TABLET | Freq: Every day | ORAL | Status: DC
  Administered 2020-07-17: 20 mg via ORAL
  Filled 2020-07-17: qty 1

## 2020-07-17 MED ORDER — CARVEDILOL 6.25 MG PO TABS
6.2500 mg | ORAL_TABLET | Freq: Two times a day (BID) | ORAL | Status: DC
  Administered 2020-07-17: 6.25 mg via ORAL
  Filled 2020-07-17: qty 1

## 2020-07-17 MED ORDER — ALBUTEROL SULFATE HFA 108 (90 BASE) MCG/ACT IN AERS: 2.0000 | INHALATION_SPRAY | Freq: Four times a day (QID) | RESPIRATORY_TRACT | 0 refills | Status: DC | PRN

## 2020-07-17 MED ORDER — CARVEDILOL 6.25 MG PO TABS: 6.2500 mg | ORAL_TABLET | Freq: Two times a day (BID) | ORAL | 0 refills | Status: DC

## 2020-07-17 MED ORDER — REGADENOSON 0.4 MG/5ML IV SOLN
0.4000 mg | Freq: Once | INTRAVENOUS | Status: AC
  Administered 2020-07-17: 10:00:00 0.4 mg via INTRAVENOUS
  Filled 2020-07-17: qty 5

## 2020-07-17 MED ORDER — ROSUVASTATIN CALCIUM 20 MG PO TABS: 20.0000 mg | ORAL_TABLET | Freq: Every day | ORAL | 0 refills | Status: DC

## 2020-07-17 MED ORDER — TECHNETIUM TC 99M TETROFOSMIN IV KIT
10.0000 | PACK | Freq: Once | INTRAVENOUS | Status: AC | PRN
  Administered 2020-07-17: 10.91 via INTRAVENOUS

## 2020-07-17 MED ORDER — PREDNISONE 10 MG PO TABS: ORAL_TABLET | ORAL | 0 refills | Status: AC

## 2020-07-17 MED ORDER — TECHNETIUM TC 99M TETROFOSMIN IV KIT
30.0000 | PACK | Freq: Once | INTRAVENOUS | Status: AC | PRN
  Administered 2020-07-17: 32.68 via INTRAVENOUS

## 2020-07-17 MED ORDER — ASPIRIN 81 MG PO TBEC: 81.0000 mg | DELAYED_RELEASE_TABLET | Freq: Every day | ORAL | 0 refills | Status: DC

## 2020-07-17 MED ORDER — ADVAIR HFA 45-21 MCG/ACT IN AERO: 2.0000 | INHALATION_SPRAY | Freq: Two times a day (BID) | RESPIRATORY_TRACT | 0 refills | Status: AC

## 2020-07-17 NOTE — Progress Notes (Signed)
Pt Sp02 92% on room air at rest Pt Sp02 85% on room air while ambulating Pt Sp02 90% and above on 2L 02 while ambulating in hall

## 2020-07-17 NOTE — Discharge Summary (Signed)
Physician Discharge Summary  Patient ID: Gregg Williams MRN: [id number] DOB/AGE: September 16, 1955 65 y.o.  Admit date: 07/16/2020 Discharge date: 07/17/2020  Admission Diagnoses:  Discharge Diagnoses:  Principal Problem:   Acute respiratory failure [facility]) Active Problems:   Chest pain   Diabetes mellitus without complication (HCC)   Obesity (BMI 30-39.9)   Thrombocytopenia ([place])   Pulmonary hypertension (HCC)   Atherosclerosis of coronary artery of native heart without angina pectoris   Atherosclerosis of aorta (HCC) COPD.   Discharged Condition: good  Hospital Course:  Gregg Williams is a 65 y.o. male with medical history significant for type 2 diabetes mellitus, morbid obesity, gout who presents to the ER via private vehicle for evaluation of chest pain. He had a normal troponin.  He states that his chest pain started at right lower chest, then went out to left upper chest.  When I examined him, he has significant chest wall tenderness in the right lower chest at the 12th rib site, also on the left third rib.  Chest pain most likely is due to chondritis.  Patient was seen by cardiology, stress test was obtained, showed normal ejection fraction without ischemic changes. Patient also was found to have hypoxemia, currently he is on 2 L oxygen.  Home evaluation reviewed oxygen saturation at 85%, he will require 2 L oxygen at 2 discharged with. Patient does not feel short of breath before admission.  CT angiogram negative for PE, did show pulmonary hypertension. Patient most likely has a COPD with pulm hypertension.  If echocardiogram cannot be performed in weekend, it should be performed in the primary care office to be evaluated for pulmonary hypertension. At this point, I will start steroid taper for short course in case patient has a COPD exacerbation.  I will also prescribe as needed albuterol as well as scheduled Advair. Currently patient is medically stable to be discharged. Patient  platelets 104, please repeat a CBC at next office visit.    Consults: cardiology  Significant Diagnostic Studies:  NM Stress test:  There was no ST segment deviation noted during stress.  The study is normal with no evidence of ischemia.  This is a low risk study.  The left ventricular ejection fraction is hyperdynamic (>65%).  CT ANGIOGRAPHY CHEST WITH CONTRAST  TECHNIQUE: Multidetector CT imaging of the chest was performed using the standard protocol during bolus administration of intravenous contrast. Multiplanar CT image reconstructions and MIPs were obtained to evaluate the vascular anatomy.  CONTRAST:  78mL OMNIPAQUE IOHEXOL 350 MG/ML SOLN  COMPARISON:  None.  FINDINGS: Cardiovascular: Heart size upper limits normal. No pericardial effusion. Dilated central pulmonary arteries. Satisfactory opacification of pulmonary arteries noted, and there is no evidence of pulmonary emboli. LAD coronary calcifications. Adequate contrast opacification of the thoracic aorta with no evidence of dissection, aneurysm, or stenosis. There is classic 3-vessel brachiocephalic arch anatomy without proximal stenosis. Scattered calcified plaque in the arch and descending thoracic aorta. Visualized proximal abdominal aorta mildly atheromatous, nondilated.  Mediastinum/Nodes: 1.1 cm enlarged AP window lymph node. Additional subcentimeter prominent pre-vascular and right paratracheal nodes.  Lungs/Pleura: Partially calcified right plaques. No pleural effusion. No pneumothorax.Pleuroparenchymal scarring at the right lung apex. Subpleural blebs in both upper lobes. Linear scarring or subsegmental atelectasis posteriorly at the right lung base.  Upper Abdomen: No acute findings.  Musculoskeletal: No chest wall abnormality. No acute or significant osseous findings.  Review of the MIP images confirms the above findings.  IMPRESSION: 1. Negative for acute PE or thoracic aortic  dissection. 2. Dilated central pulmonary arteries suggesting pulmonary hypertension. 3. Coronary and Aortic Atherosclerosis (ICD10-170.0). 4. Borderline enlarged aortopulmonary window lymph node. 5. Right pleural plaques   Electronically Signed   By: Lucrezia Europe M.D.   On: 07/16/2020 11:14  Treatments: chest pain workup  Discharge Exam: Blood pressure 122/62, pulse 73, temperature 98.6 F (37 C), resp. rate 20, height 5\' 11"  (1.803 m), weight 124.3 kg, SpO2 95 %. General appearance: alert and cooperative Resp: clear to auscultation bilaterally Cardio: regular rate and rhythm, S1, S2 normal, no murmur, click, rub or gallop GI: soft, non-tender; bowel sounds normal; no masses,  no organomegaly Extremities: extremities normal, atraumatic, no cyanosis or edema  Disposition: Discharge disposition: 01-Home or Self Care       Discharge Instructions    Diet - low sodium heart healthy   Complete by: As directed    Increase activity slowly   Complete by: As directed      Allergies as of 07/17/2020      Reactions   Benadryl [diphenhydramine] Hives      Medication List    TAKE these medications   Advair HFA 45-21 MCG/ACT inhaler Generic drug: fluticasone-salmeterol Inhale 2 puffs into the lungs 2 (two) times daily.   albuterol 108 (90 Base) MCG/ACT inhaler Commonly known as: VENTOLIN HFA Inhale 2 puffs into the lungs every 6 (six) hours as needed for wheezing or shortness of breath.   aspirin 81 MG EC tablet Take 1 tablet (81 mg total) by mouth daily. Swallow whole. Start taking on: Jul 18, 2020   carvedilol 6.25 MG tablet Commonly known as: COREG Take 1 tablet (6.25 mg total) by mouth 2 (two) times daily with a meal.   glimepiride 4 MG tablet Commonly known as: AMARYL Take 8 mg by mouth daily.   indomethacin 25 MG capsule Commonly known as: INDOCIN Take 25 mg by mouth at bedtime.   Jardiance 10 MG Tabs tablet Generic drug: empagliflozin Take 10 mg by mouth  every morning.   losartan 100 MG tablet Commonly known as: COZAAR Take 0.5 tablets by mouth at bedtime.   meloxicam 15 MG tablet Commonly known as: MOBIC Take 1 tablet (15 mg total) by mouth daily.   methocarbamol 500 MG tablet Commonly known as: Robaxin Take 1 tablet (500 mg total) by mouth 4 (four) times daily.   oxyCODONE-acetaminophen 10-325 MG tablet Commonly known as: PERCOCET Take 1 tablet by mouth every 5 (five) hours as needed.   rosuvastatin 20 MG tablet Commonly known as: CRESTOR Take 1 tablet (20 mg total) by mouth daily. Start taking on: Jul 18, 2020       Follow-up Information    Leonard Downing, MD Follow up.   Specialty: Family Medicine Contact information: [address] [place] [address] [phone]               Signed: Sharen Hones 07/17/2020, 1:21 PM

## 2020-07-17 NOTE — Consult Note (Signed)
Cardiology Consultation:   Patient ID: Gregg Williams MRN: [id number]; DOB: 03-Nov-1955  Admit date: 07/16/2020 Date of Consult: 07/17/2020  PCP:  Leonard Downing, MD   [facility] Providers Cardiologist:  New  Patient Profile:   Gregg Williams is a 65 y.o. male with a hx of HTN, diabetes type 2, obesity, and gout who is being seen 07/17/2020 for the evaluation of chest pain at the request of Dr. Roosevelt Locks.  History of Present Illness:   Gregg Williams has no significant prior cardiac history. Family history positive for pateranaluncle with heart attack, first in his 65s. No tobacco, alcohol, drug use. Works full time as Glass blower/designer. Does yard work, no formal exercise, this is difficult with back pain. Diet includes eating out a lot.   Patient presented to [facility] ED 07/16/2020 for chest pain and back pain. It started a week and a half ago in his mid back on the right side. Moved up into the left upper abdpmen, felt like he broke a rib. Pain moved to the lower left quadrant. Then 3 days ago it moved up into his left chest. Radiates into the shoulder and down the arm. No injuries or physical activity prior to pain. Pain is dull. Pain is worse with arm movement, inspiration. Not worse with exertion. Pain was constant once it started, worst it got was 8-9/10. He takes percocet for back disease. This and alazseltzer would improve pain. Never had this pain before. Has associated diaphoresis. No SOB, nausea, vomiting. No recent fever, chills.   In the ED blood pressure was elevated 176/73, pulse 85 bpm, respiratory rate 17, oxygen level 88%, afebrile.  Placed on 2 L O2.  Labs showed potassium 3.9, 6 CO2 30, glucose 96, creatinine 0.61, BUN 13, albumin 3.4, normal LFTs, WBC 4.5, hemoglobin 16.6, plts 200.Hs troponin 8>7.  D-dimer was elevated at 0.88.  A1c 7.1.  COVID-negative.  Chest CTA was negative for PE or aortic dissection, did show dilated central pulmonary arteries suggesting pulmonary  hypertension, coronary and aortic atherosclerosis right pleural plaques.  Chest x-ray showed new nodular density and hyperexpansion with chronic interstitial coarsening.  EKG showed sinus rhythm, 88 bpm, T wave inversion in anterior leads, not new, PVC.  Patient was admitted for further work-up.  Still has dull pain in the left shoulder, worse with movement.   Past Medical History:  Diagnosis Date  . Diabetes mellitus without complication [facility])     Past Surgical History:  Procedure Laterality Date  . SPINE SURGERY       Home Medications:  Prior to Admission medications   Medication Sig Start Date End Date Taking? Authorizing Provider  glimepiride (AMARYL) 4 MG tablet Take 8 mg by mouth daily. 04/29/20  Yes [provider]  indomethacin (INDOCIN) 25 MG capsule Take 25 mg by mouth at bedtime.   Yes [provider]  JARDIANCE 10 MG TABS tablet Take 10 mg by mouth every morning. 07/07/20  Yes [provider]  losartan (COZAAR) 100 MG tablet Take 0.5 tablets by mouth at bedtime. 04/04/20  Yes [provider]  oxyCODONE-acetaminophen (PERCOCET) 10-325 MG tablet Take 1 tablet by mouth every 5 (five) hours as needed. 07/01/20  Yes [provider]  meloxicam (MOBIC) 15 MG tablet Take 1 tablet (15 mg total) by mouth daily. Patient not taking: Reported on 07/16/2020 03/09/20   Cuthriell, Charline Bills, PA-C  methocarbamol (ROBAXIN) 500 MG tablet Take 1 tablet (500 mg total) by mouth 4 (four) times daily. Patient  not taking: Reported on 07/16/2020 03/09/20   Cuthriell, Charline Bills, PA-C    Inpatient Medications: Scheduled Meds: . aspirin EC  81 mg Oral Daily  . insulin aspart  0-15 Units Subcutaneous TID WC  . losartan  50 mg Oral QHS  . sodium chloride flush  3 mL Intravenous Q12H   Continuous Infusions: . sodium chloride     PRN Meds: sodium chloride, acetaminophen **OR** acetaminophen, ondansetron **OR** ondansetron (ZOFRAN) IV, oxyCODONE-acetaminophen  **AND** oxyCODONE, sodium chloride flush  Allergies:    Allergies  Allergen Reactions  . Benadryl [Diphenhydramine] Hives    Social History:   Social History   Socioeconomic History  . Marital status: Single    Spouse name: Not on file  . Number of children: Not on file  . Years of education: Not on file  . Highest education level: Not on file  Occupational History  . Not on file  Tobacco Use  . Smoking status: Former Research scientist (life sciences)  . Smokeless tobacco: Never Used  Substance and Sexual Activity  . Alcohol use: Not Currently  . Drug use: Not Currently  . Sexual activity: Not on file  Other Topics Concern  . Not on file  Social History Narrative  . Not on file   Social Determinants of Health   Financial Resource Strain: Not on file  Food Insecurity: Not on file  Transportation Needs: Not on file  Physical Activity: Not on file  Stress: Not on file  Social Connections: Not on file  Intimate Partner Violence: Not on file    Family History:    Family History  Problem Relation Age of Onset  . Other Father   . CAD Father      ROS:  Please see the history of present illness.   All other ROS reviewed and negative.     Physical Exam/Data:   Vitals:   07/16/20 1626 07/16/20 2015 07/16/20 2334 07/17/20 0609  BP: (!) 169/68 (!) 147/56 (!) 151/66 (!) 150/62  Pulse: 91 80 74 73  Resp: 20 18 20 18   Temp: 98.5 F (36.9 C) 97.8 F (36.6 C) 98.5 F (36.9 C) 98.8 F (37.1 C)  TempSrc: Oral Oral Oral Oral  SpO2: 94% 95% 96% (!) 89%  Weight:      Height:        Intake/Output Summary (Last 24 hours) at 07/17/2020 0710 Last data filed at 07/17/2020 0500 Gross per 24 hour  Intake 480 ml  Output --  Net 480 ml   Last 3 Weights 07/16/2020  Weight (lbs) 274 lb  Weight (kg) 124.286 kg     Body mass index is 38.22 kg/m.  General:  Well nourished, well developed, in no acute distress HEENT: normal Lymph: no adenopathy Neck: no JVD Endocrine:  No thryomegaly Vascular:  No carotid bruits; FA pulses 2+ bilaterally without bruits  Cardiac:  normal S1, S2; RRR; no murmur , TTP Lungs:  clear to auscultation bilaterally, no wheezing, rhonchi or rales  Abd: soft, nontender, no hepatomegaly  Ext: no edema Musculoskeletal:  No deformities, BUE and BLE strength normal and equal Skin: warm and dry  Neuro:  CNs 2-12 intact, no focal abnormalities noted Psych:  Normal affect   EKG:  The EKG was personally reviewed and demonstrates: Normal sinus rhythm, 88 bpm, T wave inversion aVL, V1 V2 (old) Telemetry:  Telemetry was personally reviewed and demonstrates:  NSR, PAC/PVCs, HR 60s  Relevant CV Studies:  Echo ordered  Myoview stress test ordered  Laboratory Data:  High Sensitivity Troponin:   Recent Labs  Lab 07/16/20 0946 07/16/20 1158  TROPONINIHS 8 7     Chemistry Recent Labs  Lab 07/16/20 0946 07/17/20 0611  NA 137 133*  K 3.9 4.4  CL 96* 95*  CO2 30 27  GLUCOSE 96 124*  BUN 13 13  CREATININE 0.61 0.56*  CALCIUM 8.7* 8.8*  GFRNONAA >60 >60  ANIONGAP 11 11    Recent Labs  Lab 07/16/20 0946  PROT 7.5  ALBUMIN 3.4*  AST 20  ALT 20  ALKPHOS 68  BILITOT 0.9   Hematology Recent Labs  Lab 07/16/20 0946 07/17/20 0611  WBC 4.5 4.2  RBC 5.43 5.31  HGB 16.6 16.3  HCT 49.1 48.0  MCV 90.4 90.4  MCH 30.6 30.7  MCHC 33.8 34.0  RDW 14.5 14.3  PLT 100* 104*   BNPNo results for input(s): BNP, PROBNP in the last 168 hours.  DDimer  Recent Labs  Lab 07/16/20 0946  DDIMER 0.88*     Radiology/Studies:  DG Chest 2 View  Result Date: 07/16/2020 CLINICAL DATA:  Chest pain EXAM: CHEST - 2 VIEW COMPARISON:  08/05/2013 FINDINGS: Cardiopericardial silhouette is at upper limits of normal for size. There is pulmonary vascular congestion without overt pulmonary edema. Similar hyperexpansion with stable appearance of right pleural thickening/scarring. New nodular density projects over the right inferior hilar region. Telemetry leads overlie the  chest. IMPRESSION: 1. New nodular density projects over the right inferior hilar region. CT chest without contrast recommended to further evaluate. 2. Hyperexpansion with chronic interstitial coarsening. Electronically Signed   By: Misty Stanley M.D.   On: 07/16/2020 09:35   CT Angio Chest PE W and/or Wo Contrast  Result Date: 07/16/2020 CLINICAL DATA:  Right back pain, chest pain, reproducible with palpation; considered high prob for PE EXAM: CT ANGIOGRAPHY CHEST WITH CONTRAST TECHNIQUE: Multidetector CT imaging of the chest was performed using the standard protocol during bolus administration of intravenous contrast. Multiplanar CT image reconstructions and MIPs were obtained to evaluate the vascular anatomy. CONTRAST:  72mL OMNIPAQUE IOHEXOL 350 MG/ML SOLN COMPARISON:  None. FINDINGS: Cardiovascular: Heart size upper limits normal. No pericardial effusion. Dilated central pulmonary arteries. Satisfactory opacification of pulmonary arteries noted, and there is no evidence of pulmonary emboli. LAD coronary calcifications. Adequate contrast opacification of the thoracic aorta with no evidence of dissection, aneurysm, or stenosis. There is classic 3-vessel brachiocephalic arch anatomy without proximal stenosis. Scattered calcified plaque in the arch and descending thoracic aorta. Visualized proximal abdominal aorta mildly atheromatous, nondilated. Mediastinum/Nodes: 1.1 cm enlarged AP window lymph node. Additional subcentimeter prominent pre-vascular and right paratracheal nodes. Lungs/Pleura: Partially calcified right plaques. No pleural effusion. No pneumothorax.Pleuroparenchymal scarring at the right lung apex. Subpleural blebs in both upper lobes. Linear scarring or subsegmental atelectasis posteriorly at the right lung base. Upper Abdomen: No acute findings. Musculoskeletal: No chest wall abnormality. No acute or significant osseous findings. Review of the MIP images confirms the above findings. IMPRESSION:  1. Negative for acute PE or thoracic aortic dissection. 2. Dilated central pulmonary arteries suggesting pulmonary hypertension. 3. Coronary and Aortic Atherosclerosis (ICD10-170.0). 4. Borderline enlarged aortopulmonary window lymph node. 5. Right pleural plaques Electronically Signed   By: Lucrezia Europe M.D.   On: 07/16/2020 11:14     Assessment and Plan:   Atypical Chest pain - presents with atypical chest pain. It is not worse with exertion, worse with movement and palpation. significantly elevated BP in the ER. Troponin negative x 2. ddimer elevated and chest  CT showed no PE or acute process, possible pulmonary HTN - currently had dull shoulder/chestp ain worse with movement and palpation - risk stratify with lipid panel - started on Aspirin 81 mg daily - will start Crestor 20mg  daily and coreg 6.25mg  BID - echo ordered - Chest CT showed coronary and aortic calcifications - Given RF and calcifications on chest CT likely needs ischemic work-up.  Stress test ordered, although with body habitus may be a difficult study. If echo or stress abnormal might need a cath, however can possibly be OP since troponin negative.  - Lifestyle changes discussed at length    Acute respiratory failure - unclear etiology - on supplemental O2, wean as able - CT chest with pulmonary HTN  HTN - elevated on admission - PTA losartan 100mg  daily>>continued - still elevated - will add coreg 6.25mg  BID  Diabetes type 2 - A1C 7.1 - per IM  Pulmonary HTN - might need OP sleep study  For questions or updates, please contact [facility] Please consult [email or web address] for contact info under    Signed, Hershall Benkert Ninfa Meeker, PA-C  07/17/2020 7:10 AM

## 2020-07-17 NOTE — TOC Transition Note (Signed)
Transition of Care [facility]) - CM/SW Discharge Note   Patient Details  Name: Gregg Williams MRN: [id number] Date of Birth: Dec 06, 1955  Transition of Care [facility]) CM/SW Contact:  Harriet Masson, RN Phone Number:[phone] 07/17/2020, 2:05 PM   Clinical Narrative:    Pt to be discharged with home oxygen. Spoke with pt and made a referral to [facility]) who will deliver portable to bedside and concentrator to the address verified in Epic. Pt states he has supportive care in the home ([place] w/ a ramp) and transportation for discharge today and to all medical appointments. Pt able to afford all prescribed medication at his local pharmacy. No other need presented at this time.  TOC will continue to assist as needed.    Final next level of care: Home/Self Care Barriers to Discharge: Barriers Resolved   Patient Goals and CMS Choice        Discharge Placement                    Patient and family notified of of transfer: 07/17/20  Discharge Plan and Services                DME Arranged: Oxygen DME Agency: Other - Comment ([employer]) Date DME Agency Contacted: 07/17/20 Time DME Agency Contacted: [phone] Representative spoke with at DME Agency: [facility] ([place]) Interventions     Readmission Risk Interventions No flowsheet data found.

## 2020-07-17 NOTE — Consult Note (Signed)
Gregg Williams is a 65 y.o. male  [id number]  Primary Cardiologist: Neoma Laming Reason for Consultation: Chest pain  HPI: Is a 65 year old white male who presented to the hospital with chest pain.  He apparently drove himself to the hospital has ruled out for myocardial infarction and thus I was asked to evaluate the patient.  Patient apparently already had nuclear stress test which is pending with cardiology clinic had EKG portion blood tests EKG showing normal sinus rhythm no acute changes and during adenosine there was no significant changes.   Review of Systems: No further chest pain   Past Medical History:  Diagnosis Date  . Diabetes mellitus without complication ([place])     Medications Prior to Admission  Medication Sig Dispense Refill  . glimepiride (AMARYL) 4 MG tablet Take 8 mg by mouth daily.    . indomethacin (INDOCIN) 25 MG capsule Take 25 mg by mouth at bedtime.    Marland Kitchen JARDIANCE 10 MG TABS tablet Take 10 mg by mouth every morning.    Marland Kitchen losartan (COZAAR) 100 MG tablet Take 0.5 tablets by mouth at bedtime.    Marland Kitchen oxyCODONE-acetaminophen (PERCOCET) 10-325 MG tablet Take 1 tablet by mouth every 5 (five) hours as needed.    . meloxicam (MOBIC) 15 MG tablet Take 1 tablet (15 mg total) by mouth daily. (Patient not taking: Reported on 07/16/2020) 30 tablet 0  . methocarbamol (ROBAXIN) 500 MG tablet Take 1 tablet (500 mg total) by mouth 4 (four) times daily. (Patient not taking: Reported on 07/16/2020) 16 tablet 0     . aspirin EC  81 mg Oral Daily  . carvedilol  6.25 mg Oral BID WC  . insulin aspart  0-15 Units Subcutaneous TID WC  . losartan  50 mg Oral QHS  . rosuvastatin  20 mg Oral Daily  . sodium chloride flush  3 mL Intravenous Q12H    Infusions: . sodium chloride      Allergies  Allergen Reactions  . Benadryl [Diphenhydramine] Hives    Social History   Socioeconomic History  . Marital status: Single    Spouse name: Not on file  . Number of children: Not on  file  . Years of education: Not on file  . Highest education level: Not on file  Occupational History  . Not on file  Tobacco Use  . Smoking status: Former Research scientist (life sciences)  . Smokeless tobacco: Never Used  Substance and Sexual Activity  . Alcohol use: Not Currently  . Drug use: Not Currently  . Sexual activity: Not on file  Other Topics Concern  . Not on file  Social History Narrative  . Not on file   Social Determinants of Health   Financial Resource Strain: Not on file  Food Insecurity: Not on file  Transportation Needs: Not on file  Physical Activity: Not on file  Stress: Not on file  Social Connections: Not on file  Intimate Partner Violence: Not on file    Family History  Problem Relation Age of Onset  . Other Father   . CAD Father     PHYSICAL EXAM: Vitals:   07/16/20 2334 07/17/20 0609  BP: (!) 151/66 (!) 150/62  Pulse: 74 73  Resp: 20 18  Temp: 98.5 F (36.9 C) 98.8 F (37.1 C)  SpO2: 96% (!) 89%     Intake/Output Summary (Last 24 hours) at 07/17/2020 1035 Last data filed at 07/17/2020 0500 Gross per 24 hour  Intake 480 ml  Output --  Net 480 ml    General:  Well appearing. No respiratory difficulty HEENT: normal Neck: supple. no JVD. Carotids 2+ bilat; no bruits. No lymphadenopathy or thryomegaly appreciated. Cor: PMI nondisplaced. Regular rate & rhythm. No rubs, gallops or murmurs. Lungs: clear Abdomen: soft, nontender, nondistended. No hepatosplenomegaly. No bruits or masses. Good bowel sounds. Extremities: no cyanosis, clubbing, rash, edema Neuro: alert & oriented x 3, cranial nerves grossly intact. moves all 4 extremities w/o difficulty. Affect pleasant.  ECG: Normal sinus rhythm with heart rate 74 bpm no acute changes  Results for orders placed or performed during the hospital encounter of 07/16/20 (from the past 24 hour(s))  Troponin I (High Sensitivity)     Status: None   Collection Time: 07/16/20 11:58 AM  Result Value Ref Range   Troponin I  (High Sensitivity) 7 <18 ng/L  SARS CORONAVIRUS 2 (TAT 6-24 HRS) Nasopharyngeal Nasopharyngeal Swab     Status: None   Collection Time: 07/16/20  1:03 PM   Specimen: Nasopharyngeal Swab  Result Value Ref Range   SARS Coronavirus 2 NEGATIVE NEGATIVE  Glucose, capillary     Status: Abnormal   Collection Time: 07/16/20  4:23 PM  Result Value Ref Range   Glucose-Capillary 171 (H) 70 - 99 mg/dL  Glucose, capillary     Status: Abnormal   Collection Time: 07/16/20  9:25 PM  Result Value Ref Range   Glucose-Capillary 151 (H) 70 - 99 mg/dL  Glucose, capillary     Status: Abnormal   Collection Time: 07/17/20  4:51 AM  Result Value Ref Range   Glucose-Capillary 139 (H) 70 - 99 mg/dL  Basic metabolic panel     Status: Abnormal   Collection Time: 07/17/20  6:11 AM  Result Value Ref Range   Sodium 133 (L) 135 - 145 mmol/L   Potassium 4.4 3.5 - 5.1 mmol/L   Chloride 95 (L) 98 - 111 mmol/L   CO2 27 22 - 32 mmol/L   Glucose, Bld 124 (H) 70 - 99 mg/dL   BUN 13 8 - 23 mg/dL   Creatinine, Ser 0.56 (L) 0.61 - 1.24 mg/dL   Calcium 8.8 (L) 8.9 - 10.3 mg/dL   GFR, Estimated >60 >60 mL/min   Anion gap 11 5 - 15  CBC     Status: Abnormal   Collection Time: 07/17/20  6:11 AM  Result Value Ref Range   WBC 4.2 4.0 - 10.5 K/uL   RBC 5.31 4.22 - 5.81 MIL/uL   Hemoglobin 16.3 13.0 - 17.0 g/dL   HCT 48.0 39.0 - 52.0 %   MCV 90.4 80.0 - 100.0 fL   MCH 30.7 26.0 - 34.0 pg   MCHC 34.0 30.0 - 36.0 g/dL   RDW 14.3 11.5 - 15.5 %   Platelets 104 (L) 150 - 400 K/uL   nRBC 0.0 0.0 - 0.2 %  Lipid panel     Status: Abnormal   Collection Time: 07/17/20  6:11 AM  Result Value Ref Range   Cholesterol 127 0 - 200 mg/dL   Triglycerides 113 <150 mg/dL   HDL 33 (L) >40 mg/dL   Total CHOL/HDL Ratio 3.8 RATIO   VLDL 23 0 - 40 mg/dL   LDL Cholesterol 71 0 - 99 mg/dL   DG Chest 2 View  Result Date: 07/16/2020 CLINICAL DATA:  Chest pain EXAM: CHEST - 2 VIEW COMPARISON:  08/05/2013 FINDINGS: Cardiopericardial  silhouette is at upper limits of normal for size. There is pulmonary vascular congestion without overt pulmonary edema.  Similar hyperexpansion with stable appearance of right pleural thickening/scarring. New nodular density projects over the right inferior hilar region. Telemetry leads overlie the chest. IMPRESSION: 1. New nodular density projects over the right inferior hilar region. CT chest without contrast recommended to further evaluate. 2. Hyperexpansion with chronic interstitial coarsening. Electronically Signed   By: Misty Stanley M.D.   On: 07/16/2020 09:35   CT Angio Chest PE W and/or Wo Contrast  Result Date: 07/16/2020 CLINICAL DATA:  Right back pain, chest pain, reproducible with palpation; considered high prob for PE EXAM: CT ANGIOGRAPHY CHEST WITH CONTRAST TECHNIQUE: Multidetector CT imaging of the chest was performed using the standard protocol during bolus administration of intravenous contrast. Multiplanar CT image reconstructions and MIPs were obtained to evaluate the vascular anatomy. CONTRAST:  14mL OMNIPAQUE IOHEXOL 350 MG/ML SOLN COMPARISON:  None. FINDINGS: Cardiovascular: Heart size upper limits normal. No pericardial effusion. Dilated central pulmonary arteries. Satisfactory opacification of pulmonary arteries noted, and there is no evidence of pulmonary emboli. LAD coronary calcifications. Adequate contrast opacification of the thoracic aorta with no evidence of dissection, aneurysm, or stenosis. There is classic 3-vessel brachiocephalic arch anatomy without proximal stenosis. Scattered calcified plaque in the arch and descending thoracic aorta. Visualized proximal abdominal aorta mildly atheromatous, nondilated. Mediastinum/Nodes: 1.1 cm enlarged AP window lymph node. Additional subcentimeter prominent pre-vascular and right paratracheal nodes. Lungs/Pleura: Partially calcified right plaques. No pleural effusion. No pneumothorax.Pleuroparenchymal scarring at the right lung apex.  Subpleural blebs in both upper lobes. Linear scarring or subsegmental atelectasis posteriorly at the right lung base. Upper Abdomen: No acute findings. Musculoskeletal: No chest wall abnormality. No acute or significant osseous findings. Review of the MIP images confirms the above findings. IMPRESSION: 1. Negative for acute PE or thoracic aortic dissection. 2. Dilated central pulmonary arteries suggesting pulmonary hypertension. 3. Coronary and Aortic Atherosclerosis (ICD10-170.0). 4. Borderline enlarged aortopulmonary window lymph node. 5. Right pleural plaques Electronically Signed   By: Lucrezia Europe M.D.   On: 07/16/2020 11:14     ASSESSMENT AND PLAN: Chest pain which appears to be atypical but has coronary calcification on CTA that was done to rule out PE.  If stress test is negative patient can be discharged with follow-up in the office this Tuesday at [facility]

## 2020-07-17 NOTE — Progress Notes (Signed)
MD order received in [facility] to discharge pt home on home oxygen; pt's portable oxygen tank perviously delivered to the pt's room; verbally reviewed AVS with pt, Rxs escribed to the [facility] in [place], [place]; no questions voiced at this time; pt's discharge pending arrival of his son for a ride home

## 2020-08-14 ENCOUNTER — Inpatient Hospital Stay
Admission: EM | Admit: 2020-08-14 | Discharge: 2020-08-20 | DRG: 834 | Disposition: A | Discharge status: Other Acute Inpatient Hospital | Payer: Commercial Managed Care - PPO | Attending: Internal Medicine | Admitting: Internal Medicine

## 2020-08-14 ENCOUNTER — Encounter: Payer: Self-pay | Admitting: Emergency Medicine

## 2020-08-14 ENCOUNTER — Observation Stay: Payer: Commercial Managed Care - PPO

## 2020-08-14 ENCOUNTER — Emergency Department: Payer: Commercial Managed Care - PPO

## 2020-08-14 ENCOUNTER — Other Ambulatory Visit: Payer: Self-pay

## 2020-08-14 DIAGNOSIS — I110 Hypertensive heart disease with heart failure: Secondary | ICD-10-CM | POA: Diagnosis present

## 2020-08-14 DIAGNOSIS — D7381 Neutropenic splenomegaly: Secondary | ICD-10-CM

## 2020-08-14 DIAGNOSIS — I4892 Unspecified atrial flutter: Secondary | ICD-10-CM | POA: Clinically undetermined

## 2020-08-14 DIAGNOSIS — K7460 Unspecified cirrhosis of liver: Secondary | ICD-10-CM | POA: Diagnosis present

## 2020-08-14 DIAGNOSIS — Z87891 Personal history of nicotine dependence: Secondary | ICD-10-CM

## 2020-08-14 DIAGNOSIS — I2510 Atherosclerotic heart disease of native coronary artery without angina pectoris: Secondary | ICD-10-CM | POA: Diagnosis present

## 2020-08-14 DIAGNOSIS — M5127 Other intervertebral disc displacement, lumbosacral region: Secondary | ICD-10-CM | POA: Diagnosis present

## 2020-08-14 DIAGNOSIS — R7989 Other specified abnormal findings of blood chemistry: Secondary | ICD-10-CM | POA: Insufficient documentation

## 2020-08-14 DIAGNOSIS — G8929 Other chronic pain: Secondary | ICD-10-CM | POA: Diagnosis present

## 2020-08-14 DIAGNOSIS — Z79899 Other long term (current) drug therapy: Secondary | ICD-10-CM

## 2020-08-14 DIAGNOSIS — Z20822 Contact with and (suspected) exposure to covid-19: Secondary | ICD-10-CM | POA: Diagnosis present

## 2020-08-14 DIAGNOSIS — J9611 Chronic respiratory failure with hypoxia: Secondary | ICD-10-CM | POA: Diagnosis not present

## 2020-08-14 DIAGNOSIS — Z8249 Family history of ischemic heart disease and other diseases of the circulatory system: Secondary | ICD-10-CM

## 2020-08-14 DIAGNOSIS — E119 Type 2 diabetes mellitus without complications: Secondary | ICD-10-CM | POA: Diagnosis not present

## 2020-08-14 DIAGNOSIS — G9341 Metabolic encephalopathy: Secondary | ICD-10-CM

## 2020-08-14 DIAGNOSIS — Z7984 Long term (current) use of oral hypoglycemic drugs: Secondary | ICD-10-CM

## 2020-08-14 DIAGNOSIS — R0600 Dyspnea, unspecified: Secondary | ICD-10-CM

## 2020-08-14 DIAGNOSIS — Z7982 Long term (current) use of aspirin: Secondary | ICD-10-CM

## 2020-08-14 DIAGNOSIS — R600 Localized edema: Secondary | ICD-10-CM

## 2020-08-14 DIAGNOSIS — E785 Hyperlipidemia, unspecified: Secondary | ICD-10-CM | POA: Diagnosis present

## 2020-08-14 DIAGNOSIS — Z791 Long term (current) use of non-steroidal anti-inflammatories (NSAID): Secondary | ICD-10-CM

## 2020-08-14 DIAGNOSIS — I272 Pulmonary hypertension, unspecified: Secondary | ICD-10-CM | POA: Diagnosis present

## 2020-08-14 DIAGNOSIS — R0902 Hypoxemia: Secondary | ICD-10-CM

## 2020-08-14 DIAGNOSIS — I4719 Other supraventricular tachycardia: Secondary | ICD-10-CM

## 2020-08-14 DIAGNOSIS — Z888 Allergy status to other drugs, medicaments and biological substances status: Secondary | ICD-10-CM

## 2020-08-14 DIAGNOSIS — D471 Chronic myeloproliferative disease: Secondary | ICD-10-CM

## 2020-08-14 DIAGNOSIS — R6510 Systemic inflammatory response syndrome (SIRS) of non-infectious origin without acute organ dysfunction: Secondary | ICD-10-CM | POA: Diagnosis present

## 2020-08-14 DIAGNOSIS — I480 Paroxysmal atrial fibrillation: Secondary | ICD-10-CM | POA: Clinically undetermined

## 2020-08-14 DIAGNOSIS — Z9981 Dependence on supplemental oxygen: Secondary | ICD-10-CM

## 2020-08-14 DIAGNOSIS — I471 Supraventricular tachycardia: Secondary | ICD-10-CM | POA: Diagnosis present

## 2020-08-14 DIAGNOSIS — I5033 Acute on chronic diastolic (congestive) heart failure: Secondary | ICD-10-CM | POA: Diagnosis present

## 2020-08-14 DIAGNOSIS — C9500 Acute leukemia of unspecified cell type not having achieved remission: Principal | ICD-10-CM

## 2020-08-14 DIAGNOSIS — E66813 Obesity, class 3: Secondary | ICD-10-CM | POA: Diagnosis present

## 2020-08-14 DIAGNOSIS — M545 Low back pain, unspecified: Secondary | ICD-10-CM | POA: Diagnosis present

## 2020-08-14 DIAGNOSIS — E876 Hypokalemia: Secondary | ICD-10-CM | POA: Diagnosis present

## 2020-08-14 DIAGNOSIS — C946 Myelodysplastic disease, not classified: Secondary | ICD-10-CM | POA: Diagnosis present

## 2020-08-14 DIAGNOSIS — Z6841 Body Mass Index (BMI) 40.0 and over, adult: Secondary | ICD-10-CM

## 2020-08-14 DIAGNOSIS — K7682 Hepatic encephalopathy: Secondary | ICD-10-CM

## 2020-08-14 DIAGNOSIS — D696 Thrombocytopenia, unspecified: Secondary | ICD-10-CM | POA: Diagnosis present

## 2020-08-14 DIAGNOSIS — R160 Hepatomegaly, not elsewhere classified: Secondary | ICD-10-CM | POA: Diagnosis not present

## 2020-08-14 DIAGNOSIS — K7200 Acute and subacute hepatic failure without coma: Secondary | ICD-10-CM | POA: Diagnosis present

## 2020-08-14 DIAGNOSIS — D61818 Other pancytopenia: Secondary | ICD-10-CM

## 2020-08-14 HISTORY — DX: Morbid (severe) obesity due to excess calories: E66.01

## 2020-08-14 HISTORY — DX: Chest pain, unspecified: R07.9

## 2020-08-14 HISTORY — DX: Other chronic pain: G89.29

## 2020-08-14 HISTORY — DX: Chronic respiratory failure, unspecified whether with hypoxia or hypercapnia: J96.10

## 2020-08-14 HISTORY — DX: Atherosclerotic heart disease of native coronary artery without angina pectoris: I25.10

## 2020-08-14 HISTORY — DX: Essential (primary) hypertension: I10

## 2020-08-14 LAB — MAGNESIUM: Magnesium: 2.1 mg/dL (ref 1.7–2.4)

## 2020-08-14 LAB — CBC WITH DIFFERENTIAL/PLATELET
Abs Immature Granulocytes: 0.09 10*3/uL — ABNORMAL HIGH (ref 0.00–0.07)
Basophils Absolute: 0 10*3/uL (ref 0.0–0.1)
Basophils Relative: 1 %
Eosinophils Absolute: 0 10*3/uL (ref 0.0–0.5)
Eosinophils Relative: 0 %
HCT: 34.2 % — ABNORMAL LOW (ref 39.0–52.0)
Hemoglobin: 11.5 g/dL — ABNORMAL LOW (ref 13.0–17.0)
Immature Granulocytes: 4 %
Lymphocytes Relative: 38 %
Lymphs Abs: 0.9 10*3/uL (ref 0.7–4.0)
MCH: 30.5 pg (ref 26.0–34.0)
MCHC: 33.6 g/dL (ref 30.0–36.0)
MCV: 90.7 fL (ref 80.0–100.0)
Monocytes Absolute: 0.1 10*3/uL (ref 0.1–1.0)
Monocytes Relative: 4 %
Neutro Abs: 1.2 10*3/uL — ABNORMAL LOW (ref 1.7–7.7)
Neutrophils Relative %: 53 %
Platelets: 41 10*3/uL — ABNORMAL LOW (ref 150–400)
RBC: 3.77 MIL/uL — ABNORMAL LOW (ref 4.22–5.81)
RDW: 14.4 % (ref 11.5–15.5)
Smear Review: NORMAL
WBC: 2.3 10*3/uL — ABNORMAL LOW (ref 4.0–10.5)
nRBC: 4.4 % — ABNORMAL HIGH (ref 0.0–0.2)

## 2020-08-14 LAB — URINALYSIS, COMPLETE (UACMP) WITH MICROSCOPIC
Bacteria, UA: NONE SEEN
Bilirubin Urine: NEGATIVE
Glucose, UA: >=500 mg/dL — AB
Hgb urine dipstick: NEGATIVE
Ketones, ur: 20 mg/dL — AB
Leukocytes,Ua: NEGATIVE
Nitrite: NEGATIVE
Protein, ur: NEGATIVE mg/dL
Specific Gravity, Urine: 1.037 — ABNORMAL HIGH (ref 1.005–1.030)
Squamous Epithelial / HPF: NONE SEEN (ref 0–5)
pH: 7 (ref 5.0–8.0)

## 2020-08-14 LAB — LACTIC ACID, PLASMA
Lactic Acid, Venous: 2.6 mmol/L (ref 0.5–1.9)
Lactic Acid, Venous: 2.2 mmol/L (ref 0.5–1.9)
Lactic Acid, Venous: 2.1 mmol/L (ref 0.5–1.9)

## 2020-08-14 LAB — CBG MONITORING, ED
Glucose-Capillary: 74 mg/dL (ref 70–99)
Glucose-Capillary: 111 mg/dL — ABNORMAL HIGH (ref 70–99)

## 2020-08-14 LAB — PROTIME-INR
INR: 1 (ref 0.8–1.2)
Prothrombin Time: 13.5 seconds (ref 11.4–15.2)

## 2020-08-14 LAB — TROPONIN I (HIGH SENSITIVITY)
Troponin I (High Sensitivity): 7 ng/L (ref <18)
Troponin I (High Sensitivity): 7 ng/L (ref <18)

## 2020-08-14 LAB — COMPREHENSIVE METABOLIC PANEL
ALT: 13 U/L (ref 0–44)
AST: 23 U/L (ref 15–41)
Albumin: 2.4 g/dL — ABNORMAL LOW (ref 3.5–5.0)
Alkaline Phosphatase: 89 U/L (ref 38–126)
Anion gap: 13 (ref 5–15)
BUN: 14 mg/dL (ref 8–23)
CO2: 31 mmol/L (ref 22–32)
Calcium: 8.4 mg/dL — ABNORMAL LOW (ref 8.9–10.3)
Chloride: 93 mmol/L — ABNORMAL LOW (ref 98–111)
Creatinine, Ser: 0.6 mg/dL — ABNORMAL LOW (ref 0.61–1.24)
GFR, Estimated: >60 mL/min (ref >60)
Glucose, Bld: 129 mg/dL — ABNORMAL HIGH (ref 70–99)
Potassium: 3.4 mmol/L — ABNORMAL LOW (ref 3.5–5.1)
Sodium: 137 mmol/L (ref 135–145)
Total Bilirubin: 1 mg/dL (ref 0.3–1.2)
Total Protein: 6.8 g/dL (ref 6.5–8.1)

## 2020-08-14 LAB — BRAIN NATRIURETIC PEPTIDE: B Natriuretic Peptide: 217.5 pg/mL — ABNORMAL HIGH (ref 0.0–100.0)

## 2020-08-14 LAB — SEDIMENTATION RATE: Sed Rate: >140 mm/hr — ABNORMAL HIGH (ref 0–20)

## 2020-08-14 LAB — PROCALCITONIN: Procalcitonin: 0.38 ng/mL

## 2020-08-14 LAB — C-REACTIVE PROTEIN: CRP: 37.4 mg/dL — ABNORMAL HIGH (ref <1.0)

## 2020-08-14 MED ORDER — OXYCODONE-ACETAMINOPHEN 5-325 MG PO TABS
2.0000 | ORAL_TABLET | Freq: Four times a day (QID) | ORAL | Status: DC | PRN
Start: 2020-08-14 — End: 2020-08-17
  Administered 2020-08-14 – 2020-08-17 (×9): 2 via ORAL
  Filled 2020-08-14 (×9): qty 2

## 2020-08-14 MED ORDER — INDOMETHACIN 25 MG PO CAPS
25.0000 mg | ORAL_CAPSULE | Freq: Every day | ORAL | Status: DC
  Administered 2020-08-15 (×2): 25 mg via ORAL
  Filled 2020-08-14 (×3): qty 1

## 2020-08-14 MED ORDER — VANCOMYCIN HCL IN DEXTROSE 1-5 GM/200ML-% IV SOLN
1000.0000 mg | Freq: Once | INTRAVENOUS | Status: AC
  Administered 2020-08-14: 1000 mg via INTRAVENOUS
  Filled 2020-08-14: qty 200

## 2020-08-14 MED ORDER — MOMETASONE FURO-FORMOTEROL FUM 100-5 MCG/ACT IN AERO
2.0000 | INHALATION_SPRAY | Freq: Two times a day (BID) | RESPIRATORY_TRACT | Status: DC
  Administered 2020-08-17 – 2020-08-19 (×3): 2 via RESPIRATORY_TRACT
  Filled 2020-08-14: qty 8.8

## 2020-08-14 MED ORDER — VANCOMYCIN HCL 1500 MG/300ML IV SOLN
1500.0000 mg | Freq: Once | INTRAVENOUS | Status: AC
  Administered 2020-08-14: 1500 mg via INTRAVENOUS
  Filled 2020-08-14: qty 300

## 2020-08-14 MED ORDER — POTASSIUM CHLORIDE CRYS ER 20 MEQ PO TBCR
40.0000 meq | EXTENDED_RELEASE_TABLET | Freq: Once | ORAL | Status: AC
  Administered 2020-08-14: 40 meq via ORAL
  Filled 2020-08-14: qty 2

## 2020-08-14 MED ORDER — SODIUM CHLORIDE 0.9 % IV SOLN
2.0000 g | Freq: Three times a day (TID) | INTRAVENOUS | Status: DC
  Administered 2020-08-14 – 2020-08-16 (×5): 2 g via INTRAVENOUS
  Filled 2020-08-14 (×7): qty 2

## 2020-08-14 MED ORDER — MORPHINE SULFATE (PF) 2 MG/ML IV SOLN
2.0000 mg | INTRAVENOUS | Status: DC | PRN
  Administered 2020-08-14 – 2020-08-17 (×6): 2 mg via INTRAVENOUS
  Filled 2020-08-14 (×6): qty 1

## 2020-08-14 MED ORDER — SODIUM CHLORIDE 0.9 % IV BOLUS
1000.0000 mL | Freq: Once | INTRAVENOUS | Status: AC
  Administered 2020-08-14: 1000 mL via INTRAVENOUS

## 2020-08-14 MED ORDER — VANCOMYCIN HCL 1250 MG/250ML IV SOLN
1250.0000 mg | Freq: Two times a day (BID) | INTRAVENOUS | Status: DC
  Administered 2020-08-15 – 2020-08-16 (×3): 1250 mg via INTRAVENOUS
  Filled 2020-08-14 (×5): qty 250

## 2020-08-14 MED ORDER — SODIUM CHLORIDE 0.9 % IV SOLN: INTRAVENOUS | Status: DC

## 2020-08-14 MED ORDER — OXYCODONE-ACETAMINOPHEN 5-325 MG PO TABS: 1.0000 | ORAL_TABLET | ORAL | Status: DC | PRN

## 2020-08-14 MED ORDER — METHOCARBAMOL 500 MG PO TABS
500.0000 mg | ORAL_TABLET | Freq: Three times a day (TID) | ORAL | Status: DC | PRN
  Administered 2020-08-15 – 2020-08-19 (×4): 500 mg via ORAL
  Filled 2020-08-14 (×5): qty 1

## 2020-08-14 MED ORDER — ASPIRIN EC 81 MG PO TBEC
81.0000 mg | DELAYED_RELEASE_TABLET | Freq: Every day | ORAL | Status: DC
  Administered 2020-08-15 – 2020-08-18 (×4): 81 mg via ORAL
  Filled 2020-08-14 (×6): qty 1

## 2020-08-14 MED ORDER — NAPHAZOLINE-GLYCERIN 0.012-0.25 % OP SOLN
1.0000 [drp] | Freq: Two times a day (BID) | OPHTHALMIC | Status: DC | PRN
  Filled 2020-08-14: qty 15

## 2020-08-14 MED ORDER — SODIUM CHLORIDE 0.9 % IV SOLN
2.0000 g | Freq: Once | INTRAVENOUS | Status: AC
  Administered 2020-08-14: 2 g via INTRAVENOUS
  Filled 2020-08-14: qty 2

## 2020-08-14 MED ORDER — ALBUTEROL SULFATE (2.5 MG/3ML) 0.083% IN NEBU: 2.5000 mg | INHALATION_SOLUTION | RESPIRATORY_TRACT | Status: DC | PRN

## 2020-08-14 MED ORDER — HYDRALAZINE HCL 20 MG/ML IJ SOLN
5.0000 mg | INTRAMUSCULAR | Status: DC | PRN
  Filled 2020-08-14: qty 1

## 2020-08-14 MED ORDER — ACETAMINOPHEN 325 MG PO TABS
650.0000 mg | ORAL_TABLET | Freq: Four times a day (QID) | ORAL | Status: DC | PRN
  Administered 2020-08-15: 650 mg via ORAL
  Filled 2020-08-14: qty 2

## 2020-08-14 MED ORDER — INSULIN ASPART 100 UNIT/ML IJ SOLN
0.0000 [IU] | Freq: Every day | INTRAMUSCULAR | Status: DC
  Filled 2020-08-14: qty 1

## 2020-08-14 MED ORDER — ROSUVASTATIN CALCIUM 20 MG PO TABS
20.0000 mg | ORAL_TABLET | Freq: Every day | ORAL | Status: DC
  Filled 2020-08-14: qty 1

## 2020-08-14 MED ORDER — DM-GUAIFENESIN ER 30-600 MG PO TB12: 1.0000 | ORAL_TABLET | Freq: Two times a day (BID) | ORAL | Status: DC | PRN

## 2020-08-14 MED ORDER — ONDANSETRON HCL 4 MG/2ML IJ SOLN
4.0000 mg | Freq: Three times a day (TID) | INTRAMUSCULAR | Status: DC | PRN
  Administered 2020-08-17: 4 mg via INTRAVENOUS
  Filled 2020-08-14: qty 2

## 2020-08-14 MED ORDER — INSULIN ASPART 100 UNIT/ML IJ SOLN
0.0000 [IU] | Freq: Three times a day (TID) | INTRAMUSCULAR | Status: DC
  Administered 2020-08-15: 1 [IU] via SUBCUTANEOUS
  Administered 2020-08-15: 2 [IU] via SUBCUTANEOUS
  Administered 2020-08-16 (×3): 1 [IU] via SUBCUTANEOUS
  Administered 2020-08-17: 3 [IU] via SUBCUTANEOUS
  Administered 2020-08-17 – 2020-08-18 (×2): 2 [IU] via SUBCUTANEOUS
  Administered 2020-08-18 (×2): 1 [IU] via SUBCUTANEOUS
  Administered 2020-08-19: 2 [IU] via SUBCUTANEOUS
  Administered 2020-08-19 (×2): 1 [IU] via SUBCUTANEOUS
  Administered 2020-08-20 (×2): 2 [IU] via SUBCUTANEOUS
  Administered 2020-08-20: 1 [IU] via SUBCUTANEOUS
  Filled 2020-08-14 (×17): qty 1

## 2020-08-14 NOTE — ED Notes (Signed)
Lab at bedside

## 2020-08-14 NOTE — ED Notes (Signed)
Critical Lab result   Lactic Acid 2.6  MD. Cherylann Banas advised

## 2020-08-14 NOTE — Consult Note (Signed)
Pharmacy Antibiotic Note  Gregg Williams is a 65 y.o. male admitted on 08/14/2020 with sepsis.  Pharmacy has been consulted for vancomycin and cefepime dosing.  Plan: Vancomycin 2500 mg IV loading dose (ordered as 1000 mg followed by 1500 mg) Start vancomycin 1250 mg IV q12h. Goal AUC 400-550  Est AUC: 498.5 Est Cmax: 32.5 Est Cmin: 13.9  Cefepime 2 g IV q8h   Monitor clinical picture, renal function, and check vancomycin levels prior to the 4th or 5th dose F/U C&S, abx deescalation / LOT   Height: 5\' 8"  (172.7 cm) Weight: 123.4 kg (272 lb) IBW/kg (Calculated) : 68.4  Temp (24hrs), Avg:98.1 F (36.7 C), Min:98 F (36.7 C), Max:98.2 F (36.8 C)  Recent Labs  Lab 08/14/20 1023 08/14/20 1248 08/14/20 1448  WBC 2.3*  --   --   CREATININE 0.60*  --   --   LATICACIDVEN  --  2.6* 2.2*    Estimated Creatinine Clearance: 122.4 mL/min (A) (by C-G formula based on SCr of 0.6 mg/dL (L)).    Allergies  Allergen Reactions   Benadryl [Diphenhydramine] Hives   Metformin     Diarrhea, stomach cramps    Antimicrobials this admission: 6/18 vancomycin >>  6/18 cefepime >>   Dose adjustments this admission: N/A  Microbiology results: 6/18 BCx: pending  Thank you for allowing pharmacy to be a part of this patient's care.  Darnelle Bos, PharmD 08/14/2020 4:54 PM

## 2020-08-14 NOTE — ED Provider Notes (Signed)
[facility] Emergency Department Provider Note ____________________________________________   Event Date/Time   First MD Initiated Contact with Patient 08/14/20 1135     (approximate)  I have reviewed the triage vital signs and the nursing notes.   HISTORY  Chief Complaint No chief complaint on file.    HPI Gregg Williams is a 65 y.o. male with PMH as noted below including diabetes, CAD, hrombocytopenia, and pulmonary hypertension who presents specifically due to abnormal labs on her recent outpatient visit, although his main complaint is right low back pain.  The patient states that he has had pain to the right lower back radiating to the right hip and leg over the last several weeks.  He saw his doctor about it and had x-rays done a few days ago.  He also had lab work drawn.  The patient states that he has had some weakness to the right leg over the last several weeks.  He has had difficulty walking although this has improved in the last several days.  He states that he takes Percocet for chronic back pain although he states that it has not been helping the more acute pain.  He denies any numbness or tingling in the lower extremities and has no urinary symptoms.  He has had no recent trauma.  He was called yesterday by his doctor and informed that he had several abnormal labs on a recent visit.  Specifically, he had an alk phos in the 140s, CRP of 30, and sed rate of 130.  He was told to come to be evaluated for possible infection or sepsis.  The patient reports that he is supposed to be on 1.5 L of O2 as needed but has not felt that he needed it recently.  He denies any increase shortness of breath, cough, fever, vomiting or diarrhea, urinary symptoms, or other acute symptoms.  Past Medical History:  Diagnosis Date   Diabetes mellitus without complication [facility])     Patient Active Problem List   Diagnosis Date Noted   Lower back pain 08/14/2020   Chronic  respiratory failure with hypoxia ([place]) 08/14/2020   Hypokalemia 08/14/2020   CAD (coronary artery disease) 08/14/2020   Elevated lactic acid level 08/14/2020   Pulmonary hypertension ([place])    Atherosclerosis of coronary artery of native heart without angina pectoris    Atherosclerosis of aorta (HCC)    Acute respiratory failure ([place]) 07/16/2020   Chest pain 07/16/2020   Diabetes mellitus without complication (HCC)    Obesity (BMI 30-39.9)    Thrombocytopenia ([place])     Past Surgical History:  Procedure Laterality Date   SPINE SURGERY      Prior to Admission medications   Medication Sig Start Date End Date Taking? Authorizing Provider  albuterol (VENTOLIN HFA) 108 (90 Base) MCG/ACT inhaler Inhale 2 puffs into the lungs every 6 (six) hours as needed for wheezing or shortness of breath. 07/17/20   Sharen Hones, MD  aspirin EC 81 MG EC tablet Take 1 tablet (81 mg total) by mouth daily. Swallow whole. 07/18/20   Sharen Hones, MD  carvedilol (COREG) 6.25 MG tablet Take 1 tablet (6.25 mg total) by mouth 2 (two) times daily with a meal. 07/17/20   Sharen Hones, MD  fluticasone-salmeterol (ADVAIR HFA) [phone] MCG/ACT inhaler Inhale 2 puffs into the lungs 2 (two) times daily. 07/17/20   Sharen Hones, MD  glimepiride (AMARYL) 4 MG tablet Take 8 mg by mouth daily. 04/29/20   [provider]  indomethacin (INDOCIN) 25 MG capsule Take 25 mg by mouth at bedtime.    [provider]  JARDIANCE 10 MG TABS tablet Take 10 mg by mouth every morning. 07/07/20   [provider]  losartan (COZAAR) 100 MG tablet Take 0.5 tablets by mouth at bedtime. 04/04/20   [provider]  meloxicam (MOBIC) 15 MG tablet Take 1 tablet (15 mg total) by mouth daily. Patient not taking: Reported on 07/16/2020 03/09/20   Cuthriell, Charline Bills, PA-C  methocarbamol (ROBAXIN) 500 MG tablet Take 1 tablet (500 mg total) by mouth 4 (four) times daily. Patient not taking: Reported on 07/16/2020 03/09/20    Cuthriell, Charline Bills, PA-C  oxyCODONE-acetaminophen (PERCOCET) 10-325 MG tablet Take 1 tablet by mouth every 5 (five) hours as needed. 07/01/20   [provider]  rosuvastatin (CRESTOR) 20 MG tablet Take 1 tablet (20 mg total) by mouth daily. 07/18/20   Sharen Hones, MD    Allergies Benadryl [diphenhydramine]  Family History  Problem Relation Age of Onset   Other Father    CAD Father     Social History Social History   Tobacco Use   Smoking status: Former    Pack years: 0.00   Smokeless tobacco: Never  Substance Use Topics   Alcohol use: Not Currently   Drug use: Not Currently    Review of Systems  Constitutional: No fever. Eyes: No redness. ENT: No sore throat. Cardiovascular: Denies chest pain. Respiratory: Denies acute shortness of breath. Gastrointestinal: No matting or diarrhea.  Genitourinary: Negative for dysuria, urinary incontinence, retention.  Musculoskeletal: Positive for back pain. Skin: Negative for rash. Neurological: Negative for headaches, focal weakness or numbness.   ____________________________________________   PHYSICAL EXAM:  VITAL SIGNS: ED Triage Vitals  Enc Vitals Group     BP 08/14/20 1009 121/68     Pulse Rate 08/14/20 1009 85     Resp 08/14/20 1009 20     Temp 08/14/20 1009 98.2 F (36.8 C)     Temp Source 08/14/20 1009 Oral     SpO2 08/14/20 1009 (!) 88 %     Weight 08/14/20 1008 272 lb (123.4 kg)     Height 08/14/20 1008 5' 8" (1.727 m)     Head Circumference --      Peak Flow --      Pain Score 08/14/20 1008 0     Pain Loc --      Pain Edu? --      Excl. in [place]? --     Constitutional: Alert and oriented.  Relatively well appearing and in no acute distress. Eyes: Conjunctivae are normal.  Head: Atraumatic. Nose: No congestion/rhinnorhea. Mouth/Throat: Mucous membranes are moist.   Neck: Normal range of motion.  Cardiovascular: Normal rate, regular rhythm. Grossly normal heart sounds.  Good peripheral  circulation. Respiratory: Normal respiratory effort.  No retractions. Lungs CTAB. Gastrointestinal: Soft and nontender. No distention.  Genitourinary: No flank tenderness. Musculoskeletal: No lower extremity edema.  Extremities warm and well perfused.  No midline spinal tenderness.  Right lumbar paraspinal tenderness. Neurologic:  Normal speech and language.  Motor and sensory intact in all extremities except for some proximal weakness in the right leg which may be related to pain.  No sensory deficits.  No weakness distally.  Normal coordination. Skin:  Skin is warm and dry. No rash noted. Psychiatric: Mood and affect are normal. Speech and behavior are normal.  ____________________________________________   LABS (all labs ordered are listed, but only abnormal results are  displayed)  Labs Reviewed  CBC WITH DIFFERENTIAL/PLATELET - Abnormal; Notable for the following components:      Result Value   WBC 2.3 (*)    RBC 3.77 (*)    Hemoglobin 11.5 (*)    HCT 34.2 (*)    Platelets 41 (*)    nRBC 4.4 (*)    Neutro Abs 1.2 (*)    Abs Immature Granulocytes 0.09 (*)    All other components within normal limits  COMPREHENSIVE METABOLIC PANEL - Abnormal; Notable for the following components:   Potassium 3.4 (*)    Chloride 93 (*)    Glucose, Bld 129 (*)    Creatinine, Ser 0.60 (*)    Calcium 8.4 (*)    Albumin 2.4 (*)    All other components within normal limits  SEDIMENTATION RATE - Abnormal; Notable for the following components:   Sed Rate >140 (*)    All other components within normal limits  LACTIC ACID, PLASMA - Abnormal; Notable for the following components:   Lactic Acid, Venous 2.6 (*)    All other components within normal limits  URINALYSIS, COMPLETE (UACMP) WITH MICROSCOPIC - Abnormal; Notable for the following components:   Color, Urine YELLOW (*)    APPearance CLEAR (*)    Specific Gravity, Urine 1.037 (*)    Glucose, UA >=500 (*)    Ketones, ur 20 (*)    All other  components within normal limits  CULTURE, BLOOD (ROUTINE X 2)  CULTURE, BLOOD (ROUTINE X 2)  SARS CORONAVIRUS 2 (TAT 6-24 HRS)  MAGNESIUM  PROTIME-INR  PROCALCITONIN  C-REACTIVE PROTEIN  LACTIC ACID, PLASMA  BRAIN NATRIURETIC PEPTIDE  APTT  TROPONIN I (HIGH SENSITIVITY)  TROPONIN I (HIGH SENSITIVITY)   ____________________________________________  EKG  ED ECG REPORT I, Arta Silence, the attending physician, personally viewed and interpreted this ECG.  Date: 08/14/2020 EKG Time: 1030 Rate: 113 Rhythm: Sinus tachycardia QRS Axis: Right axis Intervals: normal ST/T Wave abnormalities: Nonspecific abnormalities Narrative Interpretation: Nonspecific abnormalities with no evidence of acute ischemia  ____________________________________________  RADIOLOGY  Chest x-ray interpreted by me shows no focal consolidation or edema MR lumbar spine: Pending  ____________________________________________   PROCEDURES  Procedure(s) performed: No  Procedures  Critical Care performed: No ____________________________________________   INITIAL IMPRESSION / ASSESSMENT AND PLAN / ED COURSE  Pertinent labs & imaging results that were available during my care of the patient were reviewed by me and considered in my medical decision making (see chart for details).   65 year old male with PMH as noted above including CAD, pulmonary hypertension, DM, and thrombocytopenia presents primarily for right low back pain over several weeks although specifically he came to the ED today after being informed of abnormal labs taken this week.  I reviewed the past medical records in [place].  The patient was admitted to the hospital last month with chest pain with a negative troponin and negative CT angio for PE, but with pulmonary hypertension and hypoxia.  He also had x-rays of the right hip and the sacrum and coccyx on 6/14 which showed no acute fracture.  On exam the patient is  overall well-appearing.  His vital signs are normal except for O2 saturation which was 88% on room air but is in the mid 90s on 1.5 L which is what the patient was sent home with recently.  He has right lumbar paraspinal tenderness and has some proximal weakness in the right leg although he states that this is at least subacute  and appears to be somewhat mediated by pain.  There are no other neurologic deficits.  Initial labs reveal a normal alkaline phosphatase but significant thrombocytopenia.  The overall etiology of these lab abnormalities and the patient's pain is unclear.  In terms of the low back pain, given the persistent symptoms over several weeks with right lower extremity weakness and negative x-rays, I will obtain an MRI for further evaluation to rule out acute spinal cord process.  I have a low suspicion for epidural abscess or other spinal infection.  The lab work-up is suggestive of some acute inflammatory or infectious process although the patient has no other focal symptoms.  We will obtain additional labs including cardiac enzymes, lactate, UA, and reassess.  ----------------------------------------- 3:48 PM on 08/14/2020 -----------------------------------------  Lactate acid is also elevated.  Urinalysis and chest x-ray are negative, however I am concerned for sepsis.  I have ordered empiric broad-spectrum antibiotics for sepsis from unknown source as well as fluids and blood cultures.  The MRI is still pending although I doubt epidural abscess or other acute spinal infection.  I consulted Dr. Blaine Hamper from the hospitalist service for admission.  ____________________________________________   FINAL CLINICAL IMPRESSION(S) / ED DIAGNOSES  Final diagnoses:  Right-sided low back pain without sciatica, unspecified chronicity  Elevated lactic acid level      NEW MEDICATIONS STARTED DURING THIS VISIT:  New Prescriptions   No medications on file     Note:  This document was  prepared using Dragon voice recognition software and may include unintentional dictation errors.    Arta Silence, MD 08/14/20 1556

## 2020-08-14 NOTE — Consult Note (Signed)
PHARMACY -  BRIEF ANTIBIOTIC NOTE   Pharmacy has received consult(s) for cefepime and vancomycin from an ED provider.  The patient's profile has been reviewed for ht/wt/allergies/indication/available labs.    One time order(s) placed for vancomycin 1 g and cefepime 2 g x 1 dose ordered by EDP  Further antibiotics/pharmacy consults should be ordered by admitting physician if indicated.                       Thank you,  Benn Moulder, PharmD Pharmacy Resident  08/14/2020 2:06 PM

## 2020-08-14 NOTE — ED Notes (Signed)
Hospital bed placed in pt room

## 2020-08-14 NOTE — ED Triage Notes (Signed)
Pt to ED via POV with c/o abnormal labs, sent by PCP. Pt states seen here several weeks ago for CP and breathing difficulty. Pt states "that's all cleared up". Pt states went to PCP for continued low back pain. STates was called by PCP for abnormal labs, pt with a list however labs not visible in chart, list includes alk phos, CRP, and sed rate all elevated. Pt denies CP at this states, "I just haven't felt good".   Pt states is supposed to be on 1.5L via [place] O2 at home, pt not currently wearing O2 in triage, pt placed on 2l via [place] at this time.  

## 2020-08-14 NOTE — ED Notes (Signed)
Pt back from CT

## 2020-08-14 NOTE — ED Notes (Signed)
Pt's care discussed with Dr. Corky Downs, [facility] for labs and imaging.

## 2020-08-14 NOTE — ED Notes (Signed)
Family at bedside. 

## 2020-08-14 NOTE — ED Notes (Signed)
Pt given meal tray.

## 2020-08-14 NOTE — H&P (Addendum)
History and Physical    RAESHAWN VO UYQ:[id number] DOB: 04/18/1955 DOA: 08/14/2020  Referring MD/NP/PA:   PCP: Leonard Downing, MD   Patient coming from:  The patient is coming from home.  At baseline, pt is independent for most of ADL.        Chief Complaint: Lower back pain and abnormal lab  HPI: Gregg Williams is a 65 y.o. male with medical history significant of HTN, HLD, pulmonary hypertension on 1.5 L oxygen at home, CAD, obesity, thrombocytopenia, chronic back pain, who presents with lower back pain and abnormal lab.  Patient states she has chronic low back pain for more than 6 years.  She had back surgery in the past, in the past several weeks, his back pain has been progressively worsening.  The back pain is located in the lower back, constant, 7 out of 10 in severity, sharp, radiating to the right leg laterally.  Patient also has mild weakness in right leg. No urinary incontinence or loss control of bowel movement.  Patient denies chest pain, cough, shortness breath.  No fever or chills. Denies nausea vomiting, diarrhea or abdominal.  No symptoms of UTI. Pt was seen by his doctor and had x-rays and some lab tests done a few days ago. Pt was called yesterday by his doctor and informed that he had several abnormal labs on a recent visit.  Specifically, he had alk phos in the 140s, CRP of 30, and sed rate of 130.  He was told to come to hospital for further evaluation and treatment due to concerns of possible infection  ED Course: pt was found to have WBC 2.3, platelet 41 (104 on 07/17/2020), pending COVID PCR, troponin level 7, lactic acid 2.6, negative urinalysis, potassium 3.4, renal function okay, temperature normal, blood pressure 147/124, heart rate 88-92 in ED, RR 20, oxygen saturation 88% on room air, which improved to 93-95% on home 1.5 L oxygen.  Chest x-ray is negative for infiltration, but showed chronic right pleural thickening.  Patient is placed on MedSurg bed for  position  Review of Systems:   General: no fevers, chills, no body weight gain, has poor appetite, has fatigue HEENT: no blurry vision, hearing changes or sore throat Respiratory: no dyspnea, coughing, wheezing CV: no chest pain, no palpitations GI: no nausea, vomiting, abdominal pain, diarrhea, constipation GU: no dysuria, burning on urination, increased urinary frequency, hematuria  Ext: no leg edema Neuro: no unilateral weakness, numbness, or tingling, no vision change or hearing loss Skin: no rash, no skin tear. MSK: has lower back pain Heme: No easy bruising.  Travel history: No recent long distant travel.  Allergy:  Allergies  Allergen Reactions   Benadryl [Diphenhydramine] Hives   Metformin     Diarrhea, stomach cramps    Past Medical History:  Diagnosis Date   Diabetes mellitus without complication ([place])     Past Surgical History:  Procedure Laterality Date   SPINE SURGERY      Social History:  reports that he has quit smoking. He has never used smokeless tobacco. He reports previous alcohol use. He reports previous drug use.  Family History:  Family History  Problem Relation Age of Onset   Other Father    CAD Father      Prior to Admission medications   Medication Sig Start Date End Date Taking? Authorizing Provider  albuterol (VENTOLIN HFA) 108 (90 Base) MCG/ACT inhaler Inhale 2 puffs into the lungs every 6 (six) hours as needed for  wheezing or shortness of breath. 07/17/20   Sharen Hones, MD  aspirin EC 81 MG EC tablet Take 1 tablet (81 mg total) by mouth daily. Swallow whole. 07/18/20   Sharen Hones, MD  carvedilol (COREG) 6.25 MG tablet Take 1 tablet (6.25 mg total) by mouth 2 (two) times daily with a meal. 07/17/20   Sharen Hones, MD  fluticasone-salmeterol (ADVAIR HFA) [phone] MCG/ACT inhaler Inhale 2 puffs into the lungs 2 (two) times daily. 07/17/20   Sharen Hones, MD  glimepiride (AMARYL) 4 MG tablet Take 8 mg by mouth daily. 04/29/20   [provider]  indomethacin (INDOCIN) 25 MG capsule Take 25 mg by mouth at bedtime.    [provider]  JARDIANCE 10 MG TABS tablet Take 10 mg by mouth every morning. 07/07/20   [provider]  losartan (COZAAR) 100 MG tablet Take 0.5 tablets by mouth at bedtime. 04/04/20   [provider]  meloxicam (MOBIC) 15 MG tablet Take 1 tablet (15 mg total) by mouth daily. Patient not taking: Reported on 07/16/2020 03/09/20   Cuthriell, Charline Bills, PA-C  methocarbamol (ROBAXIN) 500 MG tablet Take 1 tablet (500 mg total) by mouth 4 (four) times daily. Patient not taking: Reported on 07/16/2020 03/09/20   Cuthriell, Charline Bills, PA-C  oxyCODONE-acetaminophen (PERCOCET) 10-325 MG tablet Take 1 tablet by mouth every 5 (five) hours as needed. 07/01/20   [provider]  rosuvastatin (CRESTOR) 20 MG tablet Take 1 tablet (20 mg total) by mouth daily. 07/18/20   Sharen Hones, MD    Physical Exam: Vitals:   08/14/20 1008 08/14/20 1009 08/14/20 1013 08/14/20 1230  BP:  121/68  (!) 147/124  Pulse:  85  88  Resp:  20  18  Temp:  98.2 F (36.8 C)  98 F (36.7 C)  TempSrc:  Oral    SpO2:  (!) 88% 95% 93%  Weight: 123.4 kg     Height: $Remove'5\' 8"'OoGMVhn$  (1.727 m)      General: Not in acute distress HEENT:       Eyes: PERRL, EOMI, no scleral icterus.       ENT: No discharge from the ears and nose, no pharynx injection, no tonsillar enlargement.        Neck: No JVD, no bruit, no mass felt. Heme: No neck lymph node enlargement. Cardiac: S1/S2, RRR, No murmurs, No gallops or rubs. Respiratory: No rales, wheezing, rhonchi or rubs. GI: Soft, nondistended, nontender, no rebound pain, no organomegaly, BS present. GU: No hematuria Ext: No pitting leg edema bilaterally. 1+DP/PT pulse bilaterally. Musculoskeletal: has tenderness in midline of lower back. Skin: No rashes.  Neuro: Alert, oriented X3, cranial nerves II-XII grossly intact, moves all extremities normally. Psych: Patient is not  psychotic, no suicidal or hemocidal ideation.  Labs on Admission: I have personally reviewed following labs and imaging studies  CBC: Recent Labs  Lab 08/14/20 1023  WBC 2.3*  NEUTROABS 1.2*  HGB 11.5*  HCT 34.2*  MCV 90.7  PLT 41*   Basic Metabolic Panel: Recent Labs  Lab 08/14/20 1023 08/14/20 1211  NA 137  --   K 3.4*  --   CL 93*  --   CO2 31  --   GLUCOSE 129*  --   BUN 14  --   CREATININE 0.60*  --   CALCIUM 8.4*  --   MG  --  2.1   GFR: Estimated Creatinine Clearance: 122.4 mL/min (A) (by C-G formula based on SCr of 0.6 mg/dL (  L)). Liver Function Tests: Recent Labs  Lab 08/14/20 1023  AST 23  ALT 13  ALKPHOS 89  BILITOT 1.0  PROT 6.8  ALBUMIN 2.4*   No results for input(s): LIPASE, AMYLASE in the last 168 hours. No results for input(s): AMMONIA in the last 168 hours. Coagulation Profile: Recent Labs  Lab 08/14/20 1211  INR 1.0   Cardiac Enzymes: No results for input(s): CKTOTAL, CKMB, CKMBINDEX, TROPONINI in the last 168 hours. BNP (last 3 results) No results for input(s): PROBNP in the last 8760 hours. HbA1C: No results for input(s): HGBA1C in the last 72 hours. CBG: No results for input(s): GLUCAP in the last 168 hours. Lipid Profile: No results for input(s): CHOL, HDL, LDLCALC, TRIG, CHOLHDL, LDLDIRECT in the last 72 hours. Thyroid Function Tests: No results for input(s): TSH, T4TOTAL, FREET4, T3FREE, THYROIDAB in the last 72 hours. Anemia Panel: No results for input(s): VITAMINB12, FOLATE, FERRITIN, TIBC, IRON, RETICCTPCT in the last 72 hours. Urine analysis:    Component Value Date/Time   COLORURINE YELLOW (A) 08/14/2020 1248   APPEARANCEUR CLEAR (A) 08/14/2020 1248   LABSPEC 1.037 (H) 08/14/2020 1248   PHURINE 7.0 08/14/2020 1248   GLUCOSEU >=500 (A) 08/14/2020 1248   HGBUR NEGATIVE 08/14/2020 1248   BILIRUBINUR NEGATIVE 08/14/2020 1248   KETONESUR 20 (A) 08/14/2020 1248   PROTEINUR NEGATIVE 08/14/2020 1248   NITRITE NEGATIVE  08/14/2020 1248   LEUKOCYTESUR NEGATIVE 08/14/2020 1248   Sepsis Labs: $RemoveBefo'@LABRCNTIP'iBQCjmiVoaO$ (procalcitonin:4,lacticidven:4) )No results found for this or any previous visit (from the past 240 hour(s)).   Radiological Exams on Admission: DG Chest 2 View  Result Date: 08/14/2020 CLINICAL DATA:  Hypoxia EXAM: CHEST - 2 VIEW COMPARISON:  07/16/2020 FINDINGS: Heart size upper normal. Negative for heart failure. Diffuse pleural thickening on the right is chronic and unchanged. Calcified pleural plaque on the right. Negative for infiltrate or effusion. IMPRESSION: Chronic right pleural thickening unchanged. No superimposed acute abnormality. Electronically Signed   By: Franchot Gallo M.D.   On: 08/14/2020 11:05     EKG: I have personally reviewed.  Sinus rhythm, QTC 477, frequent PAC versus multifocal atrial rhythm, occasional PVC, right axis deviation, poor R wave progression, bilateral atrial enlargement  Assessment/Plan Principal Problem:   Lower back pain Active Problems:   Diabetes mellitus without complication (HCC)   Thrombocytopenia (HCC)   Pulmonary hypertension (HCC)   Chronic respiratory failure with hypoxia (HCC)   Hypokalemia   CAD (coronary artery disease)   SIRS (systemic inflammatory response syndrome) (HCC)   Obesity, Class III, BMI 40-49.9 (morbid obesity) ([place])   Lower back pain: Patient states that he has chronic low back pain for more than 6 years.  Patient had spine surgery in the past.  Patient was followed -up by neurosurgeon, Dr. Clydell Hakim of [facility] neurosurgery, who has retired.  Then patient is currently being followed up by a PA physician in the same group (patient forgot physician's name.  In the past several weeks, patient has progressively worsening back pain.  The back pain is radiating to the right leg laterally.  Also complains of a mild right leg weakness.  No loss control of bladder or bowel movement.  Patient has elevated inflammatory marker in PCPs office, with  alk phos in the 140s, CRP of 30, and sed rate of 130.  Patient does not have fever or chills, not sure if patient has any infection, but patient meets criteria for SIRS.  -will place in med-surg bed for obs -prn Percocet, Robaxin, morphine,  tylenol -continue home indomethacin -Follow-up MRI of L-spine without contrast which is ordered by ED physician. If MRI shows new concerning findings, will need to consult neurosurgeon. -repeat CRP and ESR   Addendum: MRI-L spin negative for spinal cord compression, but showed possible myeloproliferative disorder. Pt has WBC 2.3 and thrombocytopenia. -will consult oncology, message sent to NP Zenia Resides is on-call   MRI of L spin showed: Diffusely abnormal bone marrow which has developed since 2020. Possible myeloproliferative disorder. No fracture   Right-sided disc protrusion L5-S1 with mild progression since 2020. There is flattening and displacement of the right S1 nerve root.   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  Currently we cannot completely rule out infection.  Lactic acid 2.6. -IV vancomycin and cefepime started in ED, will continue -Follow-up blood culture -will get Procalcitonin and trend lactic acid levels per sepsis protocol. -IVF: 2L of NS bolus in ED, followed by 75 cc/h   Diabetes mellitus without complication [facility]): Recent A1c 7.1, poorly controlled.  Patient is taking Jardiance and Amaryl. -SSI  Thrombocytopenia ([place]): This is chronic issue.  Platelet 104 on 07/17/2020 --> 41.  No bleeding tendency. -Follow-up by CBC  Pulmonary hypertension and chronic respiratory failure with hypoxia [facility]): Currently patient is on home level 1.5 L oxygen, saturation 93-95%.  No worsening  shortness of breath. -Bronchodilators -Check BNP  Hypokalemia: K=  3.4 on admission. - Repleted - Check Mg level  CAD (coronary artery disease): no CP -ASA -pt is not taking crestor  HLD: pt is not taking his Crestor -f/u with PCP  Obesity, Class III, BMI 40-49.9 (morbid obesity) ([place]): BMI=41.26 -Diet and exercise.   -Encouraged to lose weight.           DVT ppx: SCD Code Status: Full code Family Communication: not done, no family member is at bed side.     Disposition Plan:  Anticipate discharge back to previous environment Consults called:  none Admission status and Level of care: Med-Surg:  for obs   Status is: Observation  The patient remains OBS appropriate and will d/c before 2 midnights.  Dispo: The patient is from: Home              Anticipated d/c is to: Home              Patient currently is not medically stable to d/c.   Difficult to place patient No          Date of Service 08/14/2020    [facility]   If 7PM-7AM, please contact night-coverage [email or web address] 08/14/2020, 4:38 PM

## 2020-08-15 ENCOUNTER — Inpatient Hospital Stay: Payer: Commercial Managed Care - PPO

## 2020-08-15 DIAGNOSIS — I480 Paroxysmal atrial fibrillation: Secondary | ICD-10-CM | POA: Diagnosis present

## 2020-08-15 DIAGNOSIS — Z9981 Dependence on supplemental oxygen: Secondary | ICD-10-CM | POA: Diagnosis not present

## 2020-08-15 DIAGNOSIS — I471 Supraventricular tachycardia: Secondary | ICD-10-CM | POA: Diagnosis present

## 2020-08-15 DIAGNOSIS — C946 Myelodysplastic disease, not classified: Secondary | ICD-10-CM | POA: Diagnosis present

## 2020-08-15 DIAGNOSIS — D61818 Other pancytopenia: Secondary | ICD-10-CM

## 2020-08-15 DIAGNOSIS — R6510 Systemic inflammatory response syndrome (SIRS) of non-infectious origin without acute organ dysfunction: Secondary | ICD-10-CM | POA: Diagnosis present

## 2020-08-15 DIAGNOSIS — Z888 Allergy status to other drugs, medicaments and biological substances status: Secondary | ICD-10-CM | POA: Diagnosis not present

## 2020-08-15 DIAGNOSIS — I4892 Unspecified atrial flutter: Secondary | ICD-10-CM | POA: Diagnosis present

## 2020-08-15 DIAGNOSIS — C9500 Acute leukemia of unspecified cell type not having achieved remission: Secondary | ICD-10-CM | POA: Diagnosis present

## 2020-08-15 DIAGNOSIS — D696 Thrombocytopenia, unspecified: Secondary | ICD-10-CM | POA: Diagnosis not present

## 2020-08-15 DIAGNOSIS — E876 Hypokalemia: Secondary | ICD-10-CM | POA: Diagnosis present

## 2020-08-15 DIAGNOSIS — K7460 Unspecified cirrhosis of liver: Secondary | ICD-10-CM | POA: Diagnosis present

## 2020-08-15 DIAGNOSIS — J9611 Chronic respiratory failure with hypoxia: Secondary | ICD-10-CM | POA: Diagnosis present

## 2020-08-15 DIAGNOSIS — E1169 Type 2 diabetes mellitus with other specified complication: Secondary | ICD-10-CM | POA: Diagnosis not present

## 2020-08-15 DIAGNOSIS — G8929 Other chronic pain: Secondary | ICD-10-CM

## 2020-08-15 DIAGNOSIS — I5030 Unspecified diastolic (congestive) heart failure: Secondary | ICD-10-CM | POA: Diagnosis not present

## 2020-08-15 DIAGNOSIS — D471 Chronic myeloproliferative disease: Secondary | ICD-10-CM | POA: Diagnosis not present

## 2020-08-15 DIAGNOSIS — Z6841 Body Mass Index (BMI) 40.0 and over, adult: Secondary | ICD-10-CM | POA: Diagnosis not present

## 2020-08-15 DIAGNOSIS — E119 Type 2 diabetes mellitus without complications: Secondary | ICD-10-CM | POA: Diagnosis present

## 2020-08-15 DIAGNOSIS — M545 Low back pain, unspecified: Secondary | ICD-10-CM | POA: Diagnosis not present

## 2020-08-15 DIAGNOSIS — I110 Hypertensive heart disease with heart failure: Secondary | ICD-10-CM | POA: Diagnosis present

## 2020-08-15 DIAGNOSIS — R160 Hepatomegaly, not elsewhere classified: Secondary | ICD-10-CM | POA: Diagnosis present

## 2020-08-15 DIAGNOSIS — R600 Localized edema: Secondary | ICD-10-CM | POA: Diagnosis not present

## 2020-08-15 DIAGNOSIS — I25118 Atherosclerotic heart disease of native coronary artery with other forms of angina pectoris: Secondary | ICD-10-CM | POA: Diagnosis not present

## 2020-08-15 DIAGNOSIS — I5033 Acute on chronic diastolic (congestive) heart failure: Secondary | ICD-10-CM | POA: Diagnosis present

## 2020-08-15 DIAGNOSIS — I272 Pulmonary hypertension, unspecified: Secondary | ICD-10-CM | POA: Diagnosis present

## 2020-08-15 DIAGNOSIS — G9341 Metabolic encephalopathy: Secondary | ICD-10-CM | POA: Diagnosis present

## 2020-08-15 DIAGNOSIS — I2510 Atherosclerotic heart disease of native coronary artery without angina pectoris: Secondary | ICD-10-CM | POA: Diagnosis present

## 2020-08-15 DIAGNOSIS — K7200 Acute and subacute hepatic failure without coma: Secondary | ICD-10-CM | POA: Diagnosis present

## 2020-08-15 DIAGNOSIS — E785 Hyperlipidemia, unspecified: Secondary | ICD-10-CM | POA: Diagnosis present

## 2020-08-15 DIAGNOSIS — Z20822 Contact with and (suspected) exposure to covid-19: Secondary | ICD-10-CM | POA: Diagnosis present

## 2020-08-15 LAB — APTT
aPTT: 29 seconds (ref 24–36)
aPTT: 33 seconds (ref 24–36)

## 2020-08-15 LAB — SARS CORONAVIRUS 2 (TAT 6-24 HRS): SARS Coronavirus 2: NEGATIVE

## 2020-08-15 LAB — LACTATE DEHYDROGENASE: LDH: 573 U/L — ABNORMAL HIGH (ref 98–192)

## 2020-08-15 LAB — PROTIME-INR
INR: 1.1 (ref 0.8–1.2)
Prothrombin Time: 14.3 seconds (ref 11.4–15.2)

## 2020-08-15 LAB — CBC
HCT: 32.4 % — ABNORMAL LOW (ref 39.0–52.0)
Hemoglobin: 11 g/dL — ABNORMAL LOW (ref 13.0–17.0)
MCH: 31 pg (ref 26.0–34.0)
MCHC: 34 g/dL (ref 30.0–36.0)
MCV: 91.3 fL (ref 80.0–100.0)
Platelets: 39 10*3/uL — ABNORMAL LOW (ref 150–400)
RBC: 3.55 MIL/uL — ABNORMAL LOW (ref 4.22–5.81)
RDW: 14.5 % (ref 11.5–15.5)
WBC: 2 10*3/uL — ABNORMAL LOW (ref 4.0–10.5)
nRBC: 1.5 % — ABNORMAL HIGH (ref 0.0–0.2)

## 2020-08-15 LAB — BASIC METABOLIC PANEL
Anion gap: 10 (ref 5–15)
BUN: 12 mg/dL (ref 8–23)
CO2: 26 mmol/L (ref 22–32)
Calcium: 8.2 mg/dL — ABNORMAL LOW (ref 8.9–10.3)
Chloride: 100 mmol/L (ref 98–111)
Creatinine, Ser: 0.48 mg/dL — ABNORMAL LOW (ref 0.61–1.24)
GFR, Estimated: >60 mL/min (ref >60)
Glucose, Bld: 133 mg/dL — ABNORMAL HIGH (ref 70–99)
Potassium: 3.9 mmol/L (ref 3.5–5.1)
Sodium: 136 mmol/L (ref 135–145)

## 2020-08-15 LAB — VITAMIN B12: Vitamin B-12: 167 pg/mL — ABNORMAL LOW (ref 180–914)

## 2020-08-15 LAB — SAVE SMEAR(SSMR), FOR PROVIDER SLIDE REVIEW

## 2020-08-15 LAB — IRON AND TIBC
Iron: 78 ug/dL (ref 45–182)
Saturation Ratios: 43 % — ABNORMAL HIGH (ref 17.9–39.5)
TIBC: 182 ug/dL — ABNORMAL LOW (ref 250–450)
UIBC: 104 ug/dL

## 2020-08-15 LAB — CBG MONITORING, ED: Glucose-Capillary: 122 mg/dL — ABNORMAL HIGH (ref 70–99)

## 2020-08-15 LAB — GLUCOSE, CAPILLARY
Glucose-Capillary: 163 mg/dL — ABNORMAL HIGH (ref 70–99)
Glucose-Capillary: 105 mg/dL — ABNORMAL HIGH (ref 70–99)
Glucose-Capillary: 182 mg/dL — ABNORMAL HIGH (ref 70–99)

## 2020-08-15 LAB — FIBRINOGEN: Fibrinogen: 739 mg/dL — ABNORMAL HIGH (ref 210–475)

## 2020-08-15 LAB — FERRITIN: Ferritin: 1590 ng/mL — ABNORMAL HIGH (ref 24–336)

## 2020-08-15 LAB — LACTIC ACID, PLASMA: Lactic Acid, Venous: 1.9 mmol/L (ref 0.5–1.9)

## 2020-08-15 NOTE — Plan of Care (Signed)

## 2020-08-15 NOTE — Progress Notes (Signed)
Progress Note    Gregg Williams  BGB:[id number] DOB: [birth year]  DOA: 08/14/2020 PCP: Kaleen Mask, MD    Brief Narrative:    Medical records reviewed and are as summarized below:  Gregg Williams is an 65 y.o. male with medical history significant of HTN, HLD, pulmonary hypertension on 1.5 L oxygen at home, CAD, obesity, thrombocytopenia, chronic back pain, who presents with lower back pain and abnormal lab.   Assessment/Plan:   Principal Problem:   Lower back pain Active Problems:   Diabetes mellitus without complication (HCC)   Thrombocytopenia (HCC)   Pulmonary hypertension (HCC)   Chronic respiratory failure with hypoxia (HCC)   Hypokalemia   CAD (coronary artery disease)   SIRS (systemic inflammatory response syndrome) (HCC)   Obesity, Class III, BMI 40-49.9 (morbid obesity) (HCC)   Lower back pain: Patient states that he has chronic low back pain for more than 6 years.  Patient had spine surgery in the past.   -past several weeks, patient has progressively worsening back pain.  The back pain is radiating to the right leg laterally.  Also complains of a mild right leg weakness.  No loss control of bladder or bowel movement.   -elevated inflammatory marker in PCPs office, with alk phos in the 140s, CRP of 30, and sed rate of 130.   - MRI-L spine negative for spinal cord compression, but showed possible myeloproliferative disorder. Pt has WBC 2.3 and thrombocytopenia. -consult oncology, message sent to NP Freida Busman is on-call   SIRS (systemic inflammatory response syndrome) [facility]): Patient meets criteria for SIRS with tachycardia with heart rate up to 92 and WBC 2.3 (<4.0), no source of infection identified, therefore will treat patient as SIRS now.  Currently we cannot completely rule out infection.  Lactic acid 2.6. -IV vancomycin and cefepime started in ED, will continue -Follow-up blood cultures -low threshold to de-escalate abx   Chronic respiratory  failure: -on 1.5-2L [place]  Diabetes mellitus without complication [facility]): Recent A1c 7.1, poorly controlled.  Patient is taking Jardiance and Amaryl. -SSI   Thrombocytopenia (HCC): This is chronic issue.  Platelet 104 on 07/17/2020 --> 41.   -check smear   Pulmonary hypertension and chronic respiratory failure with hypoxia [facility]): Currently patient is on home level 1.5 L oxygen, saturation 93-95%.  No worsening shortness of breath. -Bronchodilators   Hypokalemia: K=  3.4 on admission. - Repleted   CAD (coronary artery disease):  -ASA -pt is not taking crestor   HLD: pt is not taking his Crestor -defer to PCP   Obesity, Class III, BMI 40-49.9 (morbid obesity) (HCC): Estimated body mass index is 41.36 kg/m as calculated from the following:   Height as of this encounter: 5\' 8"  (1.727 m).   Weight as of this encounter: 123.4 kg.  Body mass index is 41.36 kg/m.   Family Communication/Anticipated D/C date and plan/Code Status   DVT prophylaxis: scd Code Status: Full Code.  Disposition Plan: Status is: Observation  The patient will require care spanning > 2 midnights and should be moved to inpatient because: Ongoing diagnostic testing needed not appropriate for outpatient work up  Dispo: The patient is from: Home              Anticipated d/c is to: Home              Patient currently is not medically stable to d/c.   Difficult to place patient No         Medical  Consultants:   Oncology: NP Zenia Resides per Dr. Blaine Hamper    Subjective:   Uncomfortable in bed and chair  Objective:    Vitals:   08/15/20 0035 08/15/20 0500 08/15/20 0530 08/15/20 0839  BP:  (!) 148/59  132/80  Pulse:  73  73  Resp:    18  Temp:   98.2 F (36.8 C) 97.7 F (36.5 C)  TempSrc: Oral     SpO2:  (!) 87% 92% 94%  Weight:      Height:        Intake/Output Summary (Last 24 hours) at 08/15/2020 2130 Last data filed at 08/14/2020 1649 Gross per 24 hour  Intake 1200 ml  Output --  Net 1200 ml    Filed Weights   08/14/20 1008  Weight: 123.4 kg    Exam:  General: Appearance:    Severely obese male in chair     Lungs:     Diminished, respirations unlabored  Heart:    Normal heart rate.    MS:   All extremities are intact.          Data Reviewed:   I have personally reviewed following labs and imaging studies:  Labs: Labs show the following:   Basic Metabolic Panel: Recent Labs  Lab 08/14/20 1023 08/14/20 1211 08/15/20 0813  NA 137  --  136  K 3.4*  --  3.9  CL 93*  --  100  CO2 31  --  26  GLUCOSE 129*  --  133*  BUN 14  --  12  CREATININE 0.60*  --  0.48*  CALCIUM 8.4*  --  8.2*  MG  --  2.1  --    GFR Estimated Creatinine Clearance: 122.4 mL/min (A) (by C-G formula based on SCr of 0.48 mg/dL (L)). Liver Function Tests: Recent Labs  Lab 08/14/20 1023  AST 23  ALT 13  ALKPHOS 89  BILITOT 1.0  PROT 6.8  ALBUMIN 2.4*   No results for input(s): LIPASE, AMYLASE in the last 168 hours. No results for input(s): AMMONIA in the last 168 hours. Coagulation profile Recent Labs  Lab 08/14/20 1211  INR 1.0    CBC: Recent Labs  Lab 08/14/20 1023 08/15/20 0813  WBC 2.3* 2.0*  NEUTROABS 1.2*  --   HGB 11.5* 11.0*  HCT 34.2* 32.4*  MCV 90.7 91.3  PLT 41* 39*   Cardiac Enzymes: No results for input(s): CKTOTAL, CKMB, CKMBINDEX, TROPONINI in the last 168 hours. BNP (last 3 results) No results for input(s): PROBNP in the last 8760 hours. CBG: Recent Labs  Lab 08/14/20 1758 08/14/20 2246 08/15/20 0842  GLUCAP 74 111* 122*   D-Dimer: No results for input(s): DDIMER in the last 72 hours. Hgb A1c: No results for input(s): HGBA1C in the last 72 hours. Lipid Profile: No results for input(s): CHOL, HDL, LDLCALC, TRIG, CHOLHDL, LDLDIRECT in the last 72 hours. Thyroid function studies: No results for input(s): TSH, T4TOTAL, T3FREE, THYROIDAB in the last 72 hours.  Invalid input(s): FREET3 Anemia work up: Recent Labs    08/15/20 0814   TIBC 182*  IRON 78   Sepsis Labs: Recent Labs  Lab 08/14/20 1023 08/14/20 1211 08/14/20 1248 08/14/20 1448 08/14/20 1750 08/15/20 0813  PROCALCITON  --  0.38  --   --   --   --   WBC 2.3*  --   --   --   --  2.0*  LATICACIDVEN  --   --  2.6* 2.2* 2.1* 1.9  Microbiology Recent Results (from the past 240 hour(s))  Culture, blood (routine x 2)     Status: None (Preliminary result)   Collection Time: 08/14/20  2:19 PM   Specimen: BLOOD  Result Value Ref Range Status   Specimen Description BLOOD RIGHT ANTECUBITAL  Final   Special Requests   Final    BOTTLES DRAWN AEROBIC AND ANAEROBIC Blood Culture adequate volume   Culture   Final    NO GROWTH < 24 HOURS Performed at [facility], [address], [place], [place] [address]    Report Status PENDING  Incomplete  Culture, blood (routine x 2)     Status: None (Preliminary result)   Collection Time: 08/14/20  2:25 PM   Specimen: BLOOD  Result Value Ref Range Status   Specimen Description BLOOD RIGHT HAND  Final   Special Requests   Final    BOTTLES DRAWN AEROBIC AND ANAEROBIC Blood Culture adequate volume   Culture   Final    NO GROWTH < 24 HOURS Performed at [facility], [address], [place], [place] [address]    Report Status PENDING  Incomplete  SARS CORONAVIRUS 2 (TAT 6-24 HRS) Nasopharyngeal Nasopharyngeal Swab     Status: None   Collection Time: 08/14/20  3:46 PM   Specimen: Nasopharyngeal Swab  Result Value Ref Range Status   SARS Coronavirus 2 NEGATIVE NEGATIVE Final    Comment: (NOTE) SARS-CoV-2 target nucleic acids are NOT DETECTED.  The SARS-CoV-2 RNA is generally detectable in upper and lower respiratory specimens during the acute phase of infection. Negative results do not preclude SARS-CoV-2 infection, do not rule out co-infections with other pathogens, and should not be used as the sole basis for treatment or other patient management decisions. Negative results must be combined  with clinical observations, patient history, and epidemiological information. The expected result is Negative.  Fact Sheet for Patients: SugarRoll.be  Fact Sheet for Healthcare Providers: https://www.woods-mathews.com/  This test is not yet approved or cleared by the [place] FDA and  has been authorized for detection and/or diagnosis of SARS-CoV-2 by FDA under an Emergency Use Authorization (EUA). This EUA will remain  in effect (meaning this test can be used) for the duration of the COVID-19 declaration under Se ction 564(b)(1) of the Act, 21 U.S.C. section 360bbb-3(b)(1), unless the authorization is terminated or revoked sooner.  Performed at [facility], [place] [address] [place], [place] [address]     Procedures and diagnostic studies:  DG Chest 2 View  Result Date: 08/14/2020 CLINICAL DATA:  Hypoxia EXAM: CHEST - 2 VIEW COMPARISON:  07/16/2020 FINDINGS: Heart size upper normal. Negative for heart failure. Diffuse pleural thickening on the right is chronic and unchanged. Calcified pleural plaque on the right. Negative for infiltrate or effusion. IMPRESSION: Chronic right pleural thickening unchanged. No superimposed acute abnormality. Electronically Signed   By: Franchot Gallo M.D.   On: 08/14/2020 11:05   MR LUMBAR SPINE WO CONTRAST  Result Date: 08/14/2020 CLINICAL DATA:  Right low back pain and right hip pain. Anemia and thrombocytopenia. EXAM: MRI LUMBAR SPINE WITHOUT CONTRAST TECHNIQUE: Multiplanar, multisequence MR imaging of the lumbar spine was performed. No intravenous contrast was administered. COMPARISON:  MRI lumbar spine 11/15/2018 FINDINGS: Segmentation:  Normal Alignment:  Normal Vertebrae:  Negative for fracture. Bone marrow is diffusely abnormal and very low signal on T1 and T2. This has developed since 2020. No focal lesion identified in the bone marrow. Conus medullaris and cauda equina: Conus extends  to the L1-2  level. Conus and cauda equina appear normal. Paraspinal and other soft tissues: Negative for paraspinous mass or adenopathy Disc levels: L1-2: Mild degenerative change.  Negative for stenosis L2-3: Disc bulging and bilateral facet hypertrophy. Mild to moderate spinal stenosis. Mild subarticular stenosis bilaterally. No interval change. L3-4: Disc degeneration with diffuse disc bulging and endplate spurring. This is asymmetric to the right and similar to the prior study. Bilateral facet degeneration. Mild spinal stenosis. Moderate subarticular stenosis bilaterally unchanged. L4-5: Disc degeneration with disc bulging and diffuse endplate spurring. Small annular fissure on the right with small disc protrusion unchanged from the prior study. Bilateral facet degeneration. Mild subarticular stenosis bilaterally. L5-S1: Right-sided disc protrusion has progressed with right S1 nerve root impingement IMPRESSION: Diffusely abnormal bone marrow which has developed since 2020. Possible myeloproliferative disorder. No fracture Right-sided disc protrusion L5-S1 with mild progression since 2020. There is flattening and displacement of the right S1 nerve root. Multilevel degenerative change throughout the lumbar spine otherwise similar to the prior MRI. Electronically Signed   By: Franchot Gallo M.D.   On: 08/14/2020 17:39    Medications:    aspirin EC  81 mg Oral Daily   indomethacin  25 mg Oral QHS   insulin aspart  0-5 Units Subcutaneous QHS   insulin aspart  0-9 Units Subcutaneous TID WC   mometasone-formoterol  2 puff Inhalation BID   rosuvastatin  20 mg Oral Daily   Continuous Infusions:  sodium chloride 75 mL/hr at 08/15/20 0010   ceFEPime (MAXIPIME) IV Stopped (08/15/20 0735)   vancomycin Stopped (08/15/20 0735)     LOS: 0 days   Gregg Williams  [facility]   How to contact the [facility] Attending or Consulting provider [place] or covering provider during after hours [place], for this patient?  Check  the care team in [facility] and look for a) attending/consulting [facility] provider listed and b) the [facility] team listed Log into [email or web address] and use [place]'s universal password to access. If you do not have the password, please contact the hospital operator. Locate the [facility] provider you are looking for under [facility] and page to a number that you can be directly reached. If you still have difficulty reaching the provider, please page the [facility] (Director on Call) for the Hospitalists listed on amion for assistance.  08/15/2020, 9:22 AM

## 2020-08-15 NOTE — ED Notes (Signed)
Pt complaining of bed comfort. rn has gotten pt a reclining chair as well as more pillows. rn let pt know that medication had to come from pharmacy, he requested not to be woke up if he was asleep.

## 2020-08-15 NOTE — Consult Note (Addendum)
Hematology/Oncology Consult Note [facility]  [phone] [phone]   Patient Care Team: Leonard Downing, MD as PCP - General (Family Medicine)   Name of the patient: Gregg Williams  [id number]  [birth year]   Date of visit: 08/15/2020  History of Presenting Illness-patient is 65 year old male with medical history significant for hypertension, hyperlipidemia, pulmonary hypertension 1.5 L of oxygen at baseline, coronary artery disease, obesity with BMI of 41, chronic back pain, who presented to ER on 08/14/2020 for worsening low back pain and abnormal labs.  He had been seen by his doctor and had x-rays.  PCP noted abnormal alk phos in the 140s, CRP of 30, and sed rate of 130.  He was told to come to the hospital for evaluation and concerns of possible infection.  History of back surgery in the past.  Back pain has been progressively worsening, localizes to the low back, constant, 7-10, sharp, radiates to the right leg with mild weakness.  In the ER he underwent work-up for SIRS with tachycardia and leukopia with no obvious source of infection.  Lactic 2.6 elevated.  He received IV vancomycin and cefepime.  Blood cultures are negative after 24 hours.  Procalcitonin normal 0.38.   MRI lumbar spine without contrast on 08/14/2020 showed diffusely abnormal with very low signal T1 and T2 the bone marrow value since 2020.  No focal lesions identified.  Degenerative changes noted similar to previous studies.  He has noticed worsening fatigue, sweating, and bruising over the past several months. He's been out of work for 3 weeks. 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   Baseline hemoglobin in 16s. Today, 11.  WBC baseline in 4s, today 2.0. ANC 1200. Baseline platelets in the 100s. Today, 39.   ECOG PS- 1-2  Review of Systems  Constitutional:  Positive for diaphoresis and malaise/fatigue. Negative for chills, fever and weight loss.   HENT:  Positive for nosebleeds. Negative for hearing loss, sore throat and tinnitus.   Eyes:  Negative for blurred vision and double vision.  Respiratory:  Negative for cough, hemoptysis, shortness of breath and wheezing.   Cardiovascular:  Negative for chest pain, palpitations and leg swelling.  Gastrointestinal:  Negative for abdominal pain, blood in stool, constipation, diarrhea, melena, nausea and vomiting.  Genitourinary:  Negative for dysuria, hematuria and urgency.  Musculoskeletal:  Positive for back pain. Negative for falls, joint pain, myalgias and neck pain.  Skin:  Negative for itching and rash.  Neurological:  Negative for dizziness, tingling, sensory change, loss of consciousness, weakness and headaches.  Endo/Heme/Allergies:  Negative for environmental allergies. Bruises/bleeds easily.  Psychiatric/Behavioral:  Negative for depression. The patient is not nervous/anxious and does not have insomnia.    Allergies  Allergen Reactions   Benadryl [Diphenhydramine] Hives   Metformin     Diarrhea, stomach cramps    Patient Active Problem List   Diagnosis Date Noted   Other pancytopenia ([place])    Lower back pain 08/14/2020   Chronic respiratory failure with hypoxia ([place]) 08/14/2020   Hypokalemia 08/14/2020   CAD (coronary artery disease) 08/14/2020   Elevated lactic acid level 08/14/2020   SIRS (systemic inflammatory response syndrome) ([place]) 08/14/2020   Obesity, Class III, BMI 40-49.9 (morbid obesity) ([place]) 08/14/2020   Pulmonary hypertension (HCC)    Atherosclerosis of coronary artery of native heart without angina pectoris    Atherosclerosis of aorta (HCC)    Acute respiratory failure ([place]) 07/16/2020   Chest pain 07/16/2020  Diabetes mellitus without complication (HCC)    Obesity (BMI 30-39.9)    Thrombocytopenia ([place])      Past Medical History:  Diagnosis Date   Diabetes mellitus without complication ([place])      Past Surgical History:  Procedure Laterality  Date   SPINE SURGERY      Social History   Socioeconomic History   Marital status: Single    Spouse name: Not on file   Number of children: Not on file   Years of education: Not on file   Highest education level: Not on file  Occupational History   Not on file  Tobacco Use   Smoking status: Former    Pack years: 0.00   Smokeless tobacco: Never  Substance and Sexual Activity   Alcohol use: Not Currently   Drug use: Not Currently   Sexual activity: Not on file  Other Topics Concern   Not on file  Social History Narrative   Not on file   Social Determinants of Health   Financial Resource Strain: Not on file  Food Insecurity: Not on file  Transportation Needs: Not on file  Physical Activity: Not on file  Stress: Not on file  Social Connections: Not on file  Intimate Partner Violence: Not on file    Family History  Problem Relation Age of Onset   Other Father    CAD Father      Current Facility-Administered Medications:    acetaminophen (TYLENOL) tablet 650 mg, 650 mg, Oral, Q6H PRN, Ivor Costa, MD   albuterol (PROVENTIL) (2.5 MG/3ML) 0.083% nebulizer solution 2.5 mg, 2.5 mg, Nebulization, Q4H PRN, Ivor Costa, MD   aspirin EC tablet 81 mg, 81 mg, Oral, Daily, Ivor Costa, MD   ceFEPIme (MAXIPIME) 2 g in sodium chloride 0.9 % 100 mL IVPB, 2 g, Intravenous, Q8H, Darnelle Bos, RPH, Stopped at 08/15/20 0735   dextromethorphan-guaiFENesin ([place] DM) 30-600 MG per 12 hr tablet 1 tablet, 1 tablet, Oral, BID PRN, Ivor Costa, MD   hydrALAZINE (APRESOLINE) injection 5 mg, 5 mg, Intravenous, Q2H PRN, Ivor Costa, MD   indomethacin (INDOCIN) capsule 25 mg, 25 mg, Oral, QHS, Ivor Costa, MD, 25 mg at 08/15/20 0258   insulin aspart (novoLOG) injection 0-5 Units, 0-5 Units, Subcutaneous, QHS, Ivor Costa, MD   insulin aspart (novoLOG) injection 0-9 Units, 0-9 Units, Subcutaneous, TID WC, Ivor Costa, MD   methocarbamol (ROBAXIN) tablet 500 mg, 500 mg, Oral, Q8H PRN, Ivor Costa,  MD   mometasone-formoterol (DULERA) 100-5 MCG/ACT inhaler 2 puff, 2 puff, Inhalation, BID, Ivor Costa, MD   morphine 2 MG/ML injection 2 mg, 2 mg, Intravenous, Q4H PRN, Ivor Costa, MD, 2 mg at 08/15/20 0630   naphazoline-glycerin (CLEAR EYES REDNESS) ophth solution 1 drop, 1 drop, Both Eyes, BID PRN, Ivor Costa, MD   ondansetron (ZOFRAN) injection 4 mg, 4 mg, Intravenous, Q8H PRN, Ivor Costa, MD   oxyCODONE-acetaminophen (PERCOCET/ROXICET) 5-325 MG per tablet 2 tablet, 2 tablet, Oral, Q6H PRN, Ivor Costa, MD, 2 tablet at 08/15/20 0512   vancomycin (VANCOREADY) IVPB 1250 mg/250 mL, 1,250 mg, Intravenous, Q12H, Darnelle Bos, RPH, Stopped at 08/15/20 0735  BP (!) 145/67 (BP Location: Left Arm)   Pulse 72   Temp 98 F (36.7 C) (Oral)   Resp 20   Ht $R'5\' 8"'LX$  (1.727 m)   Wt 272 lb (123.4 kg)   SpO2 91%   BMI 41.36 kg/m    Physical Exam Constitutional:      General: He is not in  acute distress.    Appearance: He is well-developed. He is obese.     Comments: Sitting in recliner.   HENT:     Head: Normocephalic and atraumatic.     Nose: Nose normal.     Mouth/Throat:     Pharynx: No oropharyngeal exudate.  Eyes:     General: No scleral icterus.    Conjunctiva/sclera: Conjunctivae normal.  Cardiovascular:     Rate and Rhythm: Normal rate and regular rhythm.     Heart sounds: Normal heart sounds.  Pulmonary:     Effort: Pulmonary effort is normal.     Breath sounds: Normal breath sounds. No wheezing.  Abdominal:     General: Bowel sounds are normal. There is no distension.     Palpations: Abdomen is soft.     Tenderness: There is no abdominal tenderness.     Comments: Rounded. Exam limited d/t body habitus  Musculoskeletal:        General: Normal range of motion.     Cervical back: Normal range of motion and neck supple.     Right lower leg: No edema.     Left lower leg: No edema.  Skin:    General: Skin is warm and dry.     Coloration: Skin is not pale.     Findings: Bruising  (scattered bruises in various stages on both arms) present.  Neurological:     Mental Status: He is alert and oriented to person, place, and time.     Motor: No weakness.  Psychiatric:        Mood and Affect: Mood normal.        Behavior: Behavior normal.     CMP Latest Ref Rng & Units 08/15/2020  Glucose 70 - 99 mg/dL 133(H)  BUN 8 - 23 mg/dL 12  Creatinine 0.61 - 1.24 mg/dL 0.48(L)  Sodium 135 - 145 mmol/L 136  Potassium 3.5 - 5.1 mmol/L 3.9  Chloride 98 - 111 mmol/L 100  CO2 22 - 32 mmol/L 26  Calcium 8.9 - 10.3 mg/dL 8.2(L)  Total Protein 6.5 - 8.1 g/dL -  Total Bilirubin 0.3 - 1.2 mg/dL -  Alkaline Phos 38 - 126 U/L -  AST 15 - 41 U/L -  ALT 0 - 44 U/L -   CBC Latest Ref Rng & Units 08/15/2020  WBC 4.0 - 10.5 K/uL 2.0(L)  Hemoglobin 13.0 - 17.0 g/dL 11.0(L)  Hematocrit 39.0 - 52.0 % 32.4(L)  Platelets 150 - 400 K/uL 39(L)    DG Chest 2 View  Result Date: 08/14/2020 CLINICAL DATA:  Hypoxia EXAM: CHEST - 2 VIEW COMPARISON:  07/16/2020 FINDINGS: Heart size upper normal. Negative for heart failure. Diffuse pleural thickening on the right is chronic and unchanged. Calcified pleural plaque on the right. Negative for infiltrate or effusion. IMPRESSION: Chronic right pleural thickening unchanged. No superimposed acute abnormality. Electronically Signed   By: Franchot Gallo M.D.   On: 08/14/2020 11:05   CT Angio Chest PE W and/or Wo Contrast  Result Date: 07/16/2020 CLINICAL DATA:  Right back pain, chest pain, reproducible with palpation; considered high prob for PE EXAM: CT ANGIOGRAPHY CHEST WITH CONTRAST TECHNIQUE: Multidetector CT imaging of the chest was performed using the standard protocol during bolus administration of intravenous contrast. Multiplanar CT image reconstructions and MIPs were obtained to evaluate the vascular anatomy. CONTRAST:  54mL OMNIPAQUE IOHEXOL 350 MG/ML SOLN COMPARISON:  None. FINDINGS: Cardiovascular: Heart size upper limits normal. No pericardial  effusion. Dilated central pulmonary arteries. Satisfactory  opacification of pulmonary arteries noted, and there is no evidence of pulmonary emboli. LAD coronary calcifications. Adequate contrast opacification of the thoracic aorta with no evidence of dissection, aneurysm, or stenosis. There is classic 3-vessel brachiocephalic arch anatomy without proximal stenosis. Scattered calcified plaque in the arch and descending thoracic aorta. Visualized proximal abdominal aorta mildly atheromatous, nondilated. Mediastinum/Nodes: 1.1 cm enlarged AP window lymph node. Additional subcentimeter prominent pre-vascular and right paratracheal nodes. Lungs/Pleura: Partially calcified right plaques. No pleural effusion. No pneumothorax.Pleuroparenchymal scarring at the right lung apex. Subpleural blebs in both upper lobes. Linear scarring or subsegmental atelectasis posteriorly at the right lung base. Upper Abdomen: No acute findings. Musculoskeletal: No chest wall abnormality. No acute or significant osseous findings. Review of the MIP images confirms the above findings. IMPRESSION: 1. Negative for acute PE or thoracic aortic dissection. 2. Dilated central pulmonary arteries suggesting pulmonary hypertension. 3. Coronary and Aortic Atherosclerosis (ICD10-170.0). 4. Borderline enlarged aortopulmonary window lymph node. 5. Right pleural plaques Electronically Signed   By: Lucrezia Europe M.D.   On: 07/16/2020 11:14   MR LUMBAR SPINE WO CONTRAST  Result Date: 08/14/2020 CLINICAL DATA:  Right low back pain and right hip pain. Anemia and thrombocytopenia. EXAM: MRI LUMBAR SPINE WITHOUT CONTRAST TECHNIQUE: Multiplanar, multisequence MR imaging of the lumbar spine was performed. No intravenous contrast was administered. COMPARISON:  MRI lumbar spine 11/15/2018 FINDINGS: Segmentation:  Normal Alignment:  Normal Vertebrae:  Negative for fracture. Bone marrow is diffusely abnormal and very low signal on T1 and T2. This has developed since  2020. No focal lesion identified in the bone marrow. Conus medullaris and cauda equina: Conus extends to the L1-2 level. Conus and cauda equina appear normal. Paraspinal and other soft tissues: Negative for paraspinous mass or adenopathy Disc levels: L1-2: Mild degenerative change.  Negative for stenosis L2-3: Disc bulging and bilateral facet hypertrophy. Mild to moderate spinal stenosis. Mild subarticular stenosis bilaterally. No interval change. L3-4: Disc degeneration with diffuse disc bulging and endplate spurring. This is asymmetric to the right and similar to the prior study. Bilateral facet degeneration. Mild spinal stenosis. Moderate subarticular stenosis bilaterally unchanged. L4-5: Disc degeneration with disc bulging and diffuse endplate spurring. Small annular fissure on the right with small disc protrusion unchanged from the prior study. Bilateral facet degeneration. Mild subarticular stenosis bilaterally. L5-S1: Right-sided disc protrusion has progressed with right S1 nerve root impingement IMPRESSION: Diffusely abnormal bone marrow which has developed since 2020. Possible myeloproliferative disorder. No fracture Right-sided disc protrusion L5-S1 with mild progression since 2020. There is flattening and displacement of the right S1 nerve root. Multilevel degenerative change throughout the lumbar spine otherwise similar to the prior MRI. Electronically Signed   By: Franchot Gallo M.D.   On: 08/14/2020 17:39   NM Myocar Multi W/Spect W/Wall Motion / EF  Result Date: 07/17/2020  There was no ST segment deviation noted during stress.  The study is normal with no evidence of ischemia.  This is a low risk study.  The left ventricular ejection fraction is hyperdynamic (>65%).      Assessment and plan- Patient is a 65 y.o. male currently admitted for SIRS, worsening low back pain, and pancytopenia. MRI concerning for myeloproliferative disorder. Worsening neutropenia, anemia, and thrombocytopenia.  Symptomatic with chills, nosebleed, abnormal bruising, and fatigue. Recommend additional labs: pt/ptt, fibrinogen, ldh, blood smear (currently pending pathologist's review). Recommend ultrasound of abdomen to evaluate for hepatosplenomegaly. Most likely will need bone marrow biopsy. Briefly introduced this today.   Case reviewed with Dr. Rogue Bussing  who contributed to and agreed with plan of care.   Thank you for allowing me to participate in the care of this very pleasant patient.   Delaney Meigs, DNP [place] [facility] 08/15/2020 10:16 AM

## 2020-08-15 NOTE — Progress Notes (Signed)
Pt admitted to room 215 and oriented to unit equipment. Pt refusing SCD's at this time. All questions and concerns addressed by this RN.  08/15/20 1137  Vitals  Temp 98.2 F (36.8 C)  Temp Source Oral  BP (!) 151/93  MAP (mmHg) 108  BP Location Right Arm  BP Method Automatic  Patient Position (if appropriate) Lying  Pulse Rate 94  Pulse Rate Source Monitor  Resp 20  Level of Consciousness  Level of Consciousness Alert  MEWS COLOR  MEWS Score Color Green  Oxygen Therapy  SpO2 91 %  O2 Device Nasal Cannula  O2 Flow Rate (L/min) 2 L/min

## 2020-08-16 ENCOUNTER — Ambulatory Visit: Payer: Commercial Managed Care - PPO | Admitting: Physician Assistant

## 2020-08-16 DIAGNOSIS — D61818 Other pancytopenia: Secondary | ICD-10-CM | POA: Diagnosis not present

## 2020-08-16 LAB — BASIC METABOLIC PANEL
Anion gap: 11 (ref 5–15)
BUN: 12 mg/dL (ref 8–23)
CO2: 27 mmol/L (ref 22–32)
Calcium: 8.3 mg/dL — ABNORMAL LOW (ref 8.9–10.3)
Chloride: 97 mmol/L — ABNORMAL LOW (ref 98–111)
Creatinine, Ser: 0.55 mg/dL — ABNORMAL LOW (ref 0.61–1.24)
GFR, Estimated: >60 mL/min (ref >60)
Glucose, Bld: 198 mg/dL — ABNORMAL HIGH (ref 70–99)
Potassium: 3.5 mmol/L (ref 3.5–5.1)
Sodium: 135 mmol/L (ref 135–145)

## 2020-08-16 LAB — PROCALCITONIN: Procalcitonin: 0.34 ng/mL

## 2020-08-16 LAB — GLUCOSE, CAPILLARY
Glucose-Capillary: 138 mg/dL — ABNORMAL HIGH (ref 70–99)
Glucose-Capillary: 143 mg/dL — ABNORMAL HIGH (ref 70–99)
Glucose-Capillary: 148 mg/dL — ABNORMAL HIGH (ref 70–99)
Glucose-Capillary: 153 mg/dL — ABNORMAL HIGH (ref 70–99)

## 2020-08-16 LAB — CBC
HCT: 32.4 % — ABNORMAL LOW (ref 39.0–52.0)
Hemoglobin: 11.2 g/dL — ABNORMAL LOW (ref 13.0–17.0)
MCH: 30.7 pg (ref 26.0–34.0)
MCHC: 34.6 g/dL (ref 30.0–36.0)
MCV: 88.8 fL (ref 80.0–100.0)
Platelets: 38 10*3/uL — ABNORMAL LOW (ref 150–400)
RBC: 3.65 MIL/uL — ABNORMAL LOW (ref 4.22–5.81)
RDW: 14.4 % (ref 11.5–15.5)
WBC: 2.1 10*3/uL — ABNORMAL LOW (ref 4.0–10.5)
nRBC: 2.4 % — ABNORMAL HIGH (ref 0.0–0.2)

## 2020-08-16 LAB — PATHOLOGIST SMEAR REVIEW

## 2020-08-16 LAB — CREATININE, SERUM
Creatinine, Ser: 0.46 mg/dL — ABNORMAL LOW (ref 0.61–1.24)
GFR, Estimated: >60 mL/min (ref >60)

## 2020-08-16 MED ORDER — POLYETHYLENE GLYCOL 3350 17 G PO PACK: 17.0000 g | PACK | Freq: Every day | ORAL | Status: DC | PRN

## 2020-08-16 MED ORDER — ALUM & MAG HYDROXIDE-SIMETH 200-200-20 MG/5ML PO SUSP
30.0000 mL | ORAL | Status: DC | PRN
  Administered 2020-08-16: 30 mL via ORAL
  Filled 2020-08-16: qty 30

## 2020-08-16 MED ORDER — DOCUSATE SODIUM 100 MG PO CAPS
100.0000 mg | ORAL_CAPSULE | Freq: Every day | ORAL | Status: DC
  Administered 2020-08-16 – 2020-08-20 (×5): 100 mg via ORAL
  Filled 2020-08-16 (×6): qty 1

## 2020-08-16 MED ORDER — CYANOCOBALAMIN 1000 MCG/ML IJ SOLN
1000.0000 ug | Freq: Every day | INTRAMUSCULAR | Status: DC
  Administered 2020-08-16 – 2020-08-20 (×5): 1000 ug via INTRAMUSCULAR
  Filled 2020-08-16 (×6): qty 1

## 2020-08-16 NOTE — Progress Notes (Signed)
Progress Note    [name]  JKK:[id number] DOB: [birth year]  DOA: 08/14/2020 PCP: Leonard Downing, MD    Brief Narrative:    Medical records reviewed and are as summarized below:  Gregg Williams is an 65 y.o. male with medical history significant of HTN, HLD, pulmonary hypertension on 1.5 L oxygen at home, CAD, obesity, thrombocytopenia, chronic back pain, who presents with lower back pain and abnormal lab. Concern for myeloproliferative disease and BMB pending.   Assessment/Plan:   Principal Problem:   Lower back pain Active Problems:   Diabetes mellitus without complication (HCC)   Thrombocytopenia (HCC)   Pulmonary hypertension (HCC)   Chronic respiratory failure with hypoxia (HCC)   Hypokalemia   CAD (coronary artery disease)   SIRS (systemic inflammatory response syndrome) (HCC)   Obesity, Class III, BMI 40-49.9 (morbid obesity) ([place])   Other pancytopenia ([place])   Lower back pain: Patient states that he has chronic low back pain for more than 6 years.  Patient had spine surgery in the past.   -past several weeks, patient has progressively worsening back pain.  The back pain is radiating to the right leg laterally.  Also complains of a mild right leg weakness.  No loss control of bladder or bowel movement.   -elevated inflammatory marker in PCPs office, with alk phos in the 140s, CRP of 30, and sed rate of 130.   - MRI-L spine negative for spinal cord compression, but showed possible myeloproliferative disorder. Pt has WBC 2.3 and thrombocytopenia. -oncology plans for BMB in the AM   SIRS (systemic inflammatory response syndrome) [facility]): Patient meets criteria for SIRS with tachycardia with heart rate up to 92 and WBC 2.3 (<4.0), no source of infection identified, therefore will treat patient as SIRS now.  Currently we cannot completely rule out infection.  Lactic acid 2.6. -cultures negative -d/c abx and follow while in hospital   Chronic respiratory  failure: -on 1.5-2L [place]  Diabetes mellitus without complication [facility] Recent A1c 7.1, poorly controlled.  Patient is taking Jardiance and Amaryl. -SSI   Thrombocytopenia ([place]): This is chronic issue.  Platelet 104 on 07/17/2020 --> 41.   -plan for BMB   Pulmonary hypertension and chronic respiratory failure with hypoxia [facility]): Currently patient is on home level 1.5 L oxygen, saturation 93-95%.  No worsening shortness of breath. -Bronchodilators   Hypokalemia: K=  3.4 on admission. - Repleted   CAD (coronary artery disease):  -ASA -pt is not taking crestor   HLD: pt is not taking his Crestor -defer to PCP   Obesity, Class III, BMI 40-49.9 (morbid obesity) ([place]): Estimated body mass index is 41.36 kg/m as calculated from the following:   Height as of this encounter: _0  (1.727 m).   Weight as of this encounter: 123.4 kg.  Body mass index is 41.36 kg/m.   Family Communication/Anticipated D/C date and plan/Code Status   DVT prophylaxis: scd Code Status: Full Code.  Disposition Plan: Status is: inpt   Dispo: The patient is from: Home              Anticipated d/c is to: Home              Patient currently is not medically stable to d/c. Getting BMB   Difficult to place patient No         Medical Consultants:   Oncology    Subjective:   Still having some back pain  Objective:    Vitals:  08/15/20 1652 08/15/20 2029 08/16/20 0450 08/16/20 0840  BP: (!) 141/79 (!) 155/72 (!) 131/59 (!) 141/59  Pulse: 66 79 63 83  Resp: _0 Temp: 98 F (36.7 C) 97.8 F (36.6 C) 99 F (37.2 C) 97.8 F (36.6 C)  TempSrc:  Oral Oral Oral  SpO2: 100% 94% 96% 94%  Weight:      Height:        Intake/Output Summary (Last 24 hours) at 08/16/2020 1122 Last data filed at 08/16/2020 1025 Gross per 24 hour  Intake 1251.15 ml  Output 200 ml  Net 1051.15 ml   Filed Weights   08/14/20 1008  Weight: 123.4 kg    Exam:  General: Appearance:    Severely obese  male in no acute distress     Lungs:     respirations unlabored  Heart:    Normal heart rate.    MS:   All extremities are intact.    Neurologic:   Awake, alert, oriented x 3.           Data Reviewed:   I have personally reviewed following labs and imaging studies:  Labs: Labs show the following:   Basic Metabolic Panel: Recent Labs  Lab 08/14/20 1023 08/14/20 1211 08/15/20 0813 08/16/20 0657 08/16/20 0937  NA 137  --  136  --  135  K 3.4*  --  3.9  --  3.5  CL 93*  --  100  --  97*  CO2 31  --  26  --  27  GLUCOSE 129*  --  133*  --  198*  BUN 14  --  12  --  12  CREATININE 0.60*  --  0.48* 0.46* 0.55*  CALCIUM 8.4*  --  8.2*  --  8.3*  MG  --  2.1  --   --   --    GFR Estimated Creatinine Clearance: 122.4 mL/min (A) (by C-G formula based on SCr of 0.55 mg/dL (L)). Liver Function Tests: Recent Labs  Lab 08/14/20 1023  AST 23  ALT 13  ALKPHOS 89  BILITOT 1.0  PROT 6.8  ALBUMIN 2.4*   No results for input(s): LIPASE, AMYLASE in the last 168 hours. No results for input(s): AMMONIA in the last 168 hours. Coagulation profile Recent Labs  Lab 08/14/20 1211 08/15/20 1030  INR 1.0 1.1    CBC: Recent Labs  Lab 08/14/20 1023 08/15/20 0813 08/16/20 0937  WBC 2.3* 2.0* 2.1*  NEUTROABS 1.2*  --   --   HGB 11.5* 11.0* 11.2*  HCT 34.2* 32.4* 32.4*  MCV 90.7 91.3 88.8  PLT 41* 39* 38*   Cardiac Enzymes: No results for input(s): CKTOTAL, CKMB, CKMBINDEX, TROPONINI in the last 168 hours. BNP (last 3 results) No results for input(s): PROBNP in the last 8760 hours. CBG: Recent Labs  Lab 08/15/20 0842 08/15/20 1223 08/15/20 1739 08/15/20 2101 08/16/20 0749  GLUCAP 122* 163* 105* 182* 138*   D-Dimer: No results for input(s): DDIMER in the last 72 hours. Hgb A1c: No results for input(s): HGBA1C in the last 72 hours. Lipid Profile: No results for input(s): CHOL, HDL, LDLCALC, TRIG, CHOLHDL, LDLDIRECT in the last 72 hours. Thyroid function  studies: No results for input(s): TSH, T4TOTAL, T3FREE, THYROIDAB in the last 72 hours.  Invalid input(s): FREET3 Anemia work up: Recent Labs    08/15/20 0814 08/15/20 0817  VITAMINB12  --  167*  FERRITIN 1,590*  --   TIBC 182*  --  IRON 78  --    Sepsis Labs: Recent Labs  Lab 08/14/20 1023 08/14/20 1211 08/14/20 1248 08/14/20 1448 08/14/20 1750 08/15/20 0813 08/16/20 0937  PROCALCITON  --  0.38  --   --   --   --  0.34  WBC 2.3*  --   --   --   --  2.0* 2.1*  LATICACIDVEN  --   --  2.6* 2.2* 2.1* 1.9  --     Microbiology Recent Results (from the past 240 hour(s))  Culture, blood (routine x 2)     Status: None (Preliminary result)   Collection Time: 08/14/20  2:19 PM   Specimen: BLOOD  Result Value Ref Range Status   Specimen Description BLOOD RIGHT ANTECUBITAL  Final   Special Requests   Final    BOTTLES DRAWN AEROBIC AND ANAEROBIC Blood Culture adequate volume   Culture   Final    NO GROWTH < 24 HOURS Performed at [facility], [address] [place], [place] [address]    Report Status PENDING  Incomplete  Culture, blood (routine x 2)     Status: None (Preliminary result)   Collection Time: 08/14/20  2:25 PM   Specimen: BLOOD  Result Value Ref Range Status   Specimen Description BLOOD RIGHT HAND  Final   Special Requests   Final    BOTTLES DRAWN AEROBIC AND ANAEROBIC Blood Culture adequate volume   Culture   Final    NO GROWTH < 24 HOURS Performed at [facility], [address] [place], [place] [address]    Report Status PENDING  Incomplete  SARS CORONAVIRUS 2 (TAT 6-24 HRS) Nasopharyngeal Nasopharyngeal Swab     Status: None   Collection Time: 08/14/20  3:46 PM   Specimen: Nasopharyngeal Swab  Result Value Ref Range Status   SARS Coronavirus 2 NEGATIVE NEGATIVE Final    Comment: (NOTE) SARS-CoV-2 target nucleic acids are NOT DETECTED.  The SARS-CoV-2 RNA is generally detectable in upper and lower respiratory specimens during  the acute phase of infection. Negative results do not preclude SARS-CoV-2 infection, do not rule out co-infections with other pathogens, and should not be used as the sole basis for treatment or other patient management decisions. Negative results must be combined with clinical observations, patient history, and epidemiological information. The expected result is Negative.  Fact Sheet for Patients: SugarRoll.be  Fact Sheet for Healthcare Providers: https://www.woods-mathews.com/  This test is not yet approved or cleared by the [place] FDA and  has been authorized for detection and/or diagnosis of SARS-CoV-2 by FDA under an Emergency Use Authorization (EUA). This EUA will remain  in effect (meaning this test can be used) for the duration of the COVID-19 declaration under Se ction 564(b)(1) of the Act, 21 U.S.C. section 360bbb-3(b)(1), unless the authorization is terminated or revoked sooner.  Performed at [facility], [place] [address] [place], [place] [address]     Procedures and diagnostic studies:  MR LUMBAR SPINE WO CONTRAST  Result Date: 08/14/2020 CLINICAL DATA:  Right low back pain and right hip pain. Anemia and thrombocytopenia. EXAM: MRI LUMBAR SPINE WITHOUT CONTRAST TECHNIQUE: Multiplanar, multisequence MR imaging of the lumbar spine was performed. No intravenous contrast was administered. COMPARISON:  MRI lumbar spine 11/15/2018 FINDINGS: Segmentation:  Normal Alignment:  Normal Vertebrae:  Negative for fracture. Bone marrow is diffusely abnormal and very low signal on T1 and T2. This has developed since 2020. No focal lesion identified in the bone marrow. Conus medullaris  and cauda equina: Conus extends to the L1-2 level. Conus and cauda equina appear normal. Paraspinal and other soft tissues: Negative for paraspinous mass or adenopathy Disc levels: L1-2: Mild degenerative change.  Negative for stenosis L2-3: Disc bulging  and bilateral facet hypertrophy. Mild to moderate spinal stenosis. Mild subarticular stenosis bilaterally. No interval change. L3-4: Disc degeneration with diffuse disc bulging and endplate spurring. This is asymmetric to the right and similar to the prior study. Bilateral facet degeneration. Mild spinal stenosis. Moderate subarticular stenosis bilaterally unchanged. L4-5: Disc degeneration with disc bulging and diffuse endplate spurring. Small annular fissure on the right with small disc protrusion unchanged from the prior study. Bilateral facet degeneration. Mild subarticular stenosis bilaterally. L5-S1: Right-sided disc protrusion has progressed with right S1 nerve root impingement IMPRESSION: Diffusely abnormal bone marrow which has developed since 2020. Possible myeloproliferative disorder. No fracture Right-sided disc protrusion L5-S1 with mild progression since 2020. There is flattening and displacement of the right S1 nerve root. Multilevel degenerative change throughout the lumbar spine otherwise similar to the prior MRI. Electronically Signed   By: Franchot Gallo M.D.   On: 08/14/2020 17:39   US Abdomen Complete  Result Date: 08/15/2020 CLINICAL DATA:  Hepatomegaly EXAM: ABDOMEN ULTRASOUND COMPLETE COMPARISON:  CT 11/16/2011 FINDINGS: Gallbladder: Diffuse gallbladder wall thickening measuring approximately 6.5 mm. No gallstones or pericholecystic fluid visualized. No sonographic Murphy sign noted by sonographer. Common bile duct: Diameter: 3 mm. Liver: Liver is enlarged measuring at least 22 cm in length. Nodular hepatic surface contour. No focal lesion identified. Within normal limits in parenchymal echogenicity. Portal vein is patent on color Doppler imaging with normal direction of blood flow towards the liver. IVC: No abnormality visualized. Pancreas: Visualized portion unremarkable. Spleen: Appearance is within normal limits. Spleen measures 12.1 cm in length. Right Kidney: Length: 12.2 cm.  Echogenicity within normal limits. No mass or hydronephrosis visualized. Left Kidney: Length: 12.6 cm. Echogenicity within normal limits. No mass or hydronephrosis visualized. Abdominal aorta: No aneurysm visualized. Other findings: Small volume perihepatic ascites. IMPRESSION: 1. Hepatomegaly. Nodular hepatic surface contour suggestive of cirrhosis. Correlate with liver function tests. 2. Small volume perihepatic ascites. 3. Spleen measures upper limits of normal in size. 4. Diffuse gallbladder wall thickening which may be secondary to intrinsic liver disease. No cholelithiasis and no sonographic Murphy sign to suggest cholecystitis. Electronically Signed   By: Davina Poke D.O.   On: 08/15/2020 17:20    Medications:    aspirin EC  81 mg Oral Daily   cyanocobalamin  1,000 mcg Intramuscular Daily   docusate sodium  100 mg Oral Daily   indomethacin  25 mg Oral QHS   insulin aspart  0-5 Units Subcutaneous QHS   insulin aspart  0-9 Units Subcutaneous TID WC   mometasone-formoterol  2 puff Inhalation BID   Continuous Infusions:  ceFEPime (MAXIPIME) IV 2 g (08/16/20 0609)   vancomycin 1,250 mg (08/16/20 0400)     LOS: 1 day   Geradine Girt  [facility]   How to contact the [facility] Attending or Consulting provider [place] or covering provider during after hours [place], for this patient?  Check the care team in [facility] and look for a) attending/consulting [facility] provider listed and b) the [facility] team listed Log into [email or web address] and use [place]'s universal password to access. If you do not have the password, please contact the hospital operator. Locate the [facility] provider you are looking for under [facility] and page to a number that you  can be directly reached. If you still have difficulty reaching the provider, please page the [facility] (Director on Call) for the Hospitalists listed on amion for assistance.  08/16/2020, 11:22 AM

## 2020-08-16 NOTE — Consult Note (Signed)
Chief Complaint: Patient was seen in consultation today for bone marrow biopsy with aspiration.   Referring Physician(s): Dr. Rogue Bussing  Supervising Physician: Markus Daft  Patient Status: Gregg Williams - In-pt  History of Present Illness: Gregg Williams is a 65 y.o. male with a medical history significant for HTN, DM, pulmonary hypertension on 1.5 L oxygen at baseline, CAD, obesity and chronic back pain. He was sent to the [facility] ED 08/14/20 by his PCP for worsening low back pain and abnormal labs - alkaline phos in the 140's, CRP 30 and sed rate of 130. In the ED he underwent work up for SIRS with tachycardia, leukopenia and lactic acid of 2.6. MRI lumbar spine showed diffusely abnormal bone marrow; possible myeloproliferative disorder.   Interventional Radiology has been asked to evaluate this patient for an image-guided bone marrow biopsy with aspiration for further work up.    Past Medical History:  Diagnosis Date   Diabetes mellitus without complication ([place])     Past Surgical History:  Procedure Laterality Date   SPINE SURGERY      Allergies: Benadryl [diphenhydramine] and Metformin  Medications: Prior to Admission medications   Medication Sig Start Date End Date Taking? Authorizing Provider  albuterol (VENTOLIN HFA) 108 (90 Base) MCG/ACT inhaler Inhale 2 puffs into the lungs every 6 (six) hours as needed for wheezing or shortness of breath. 07/17/20  Yes Sharen Hones, MD  Aspirin Effervescent (ALKA-SELTZER EXTRA STRENGTH PO) Take 1 packet by mouth 5 (five) times daily. With percocet   Yes [provider]  fluticasone-salmeterol (ADVAIR HFA) 45-21 MCG/ACT inhaler Inhale 2 puffs into the lungs 2 (two) times daily. 07/17/20  Yes Sharen Hones, MD  glimepiride (AMARYL) 4 MG tablet Take 8 mg by mouth daily. 04/29/20  Yes [provider]  indomethacin (INDOCIN) 25 MG capsule Take 25 mg by mouth at bedtime.   Yes [provider]  JARDIANCE 10 MG TABS tablet Take  10 mg by mouth every morning. 07/07/20  Yes [provider]  oxyCODONE-acetaminophen (PERCOCET) 10-325 MG tablet Take 1 tablet by mouth every 5 (five) hours as needed. 07/01/20  Yes [provider]  tetrahydrozoline-zinc (VISINE-AC) 0.05-0.25 % ophthalmic solution Place 2 drops into both eyes 3 (three) times daily as needed.   Yes [provider]  aspirin EC 81 MG EC tablet Take 1 tablet (81 mg total) by mouth daily. Swallow whole. Patient not taking: Reported on 08/14/2020 07/18/20   Sharen Hones, MD  carvedilol (COREG) 6.25 MG tablet Take 1 tablet (6.25 mg total) by mouth 2 (two) times daily with a meal. Patient not taking: Reported on 08/14/2020 07/17/20   Sharen Hones, MD  losartan (COZAAR) 100 MG tablet Take 0.5 tablets by mouth at bedtime. Patient not taking: Reported on 08/14/2020 04/04/20   [provider]  meloxicam (MOBIC) 15 MG tablet Take 1 tablet (15 mg total) by mouth daily. Patient not taking: No sig reported 03/09/20   Cuthriell, Charline Bills, PA-C  methocarbamol (ROBAXIN) 500 MG tablet Take 1 tablet (500 mg total) by mouth 4 (four) times daily. Patient not taking: No sig reported 03/09/20   Cuthriell, Charline Bills, PA-C  rosuvastatin (CRESTOR) 20 MG tablet Take 1 tablet (20 mg total) by mouth daily. Patient not taking: Reported on 08/14/2020 07/18/20   Sharen Hones, MD     Family History  Problem Relation Age of Onset   Other Father    CAD Father     Social History   Socioeconomic History  Marital status: Single    Spouse name: Not on file   Number of children: Not on file   Years of education: Not on file   Highest education level: Not on file  Occupational History   Not on file  Tobacco Use   Smoking status: Former    Pack years: 0.00   Smokeless tobacco: Never  Substance and Sexual Activity   Alcohol use: Not Currently   Drug use: Not Currently   Sexual activity: Not on file  Other Topics Concern   Not on file  Social History Narrative    Not on file   Social Determinants of Health   Financial Resource Strain: Not on file  Food Insecurity: Not on file  Transportation Needs: Not on file  Physical Activity: Not on file  Stress: Not on file  Social Connections: Not on file    Review of Systems: A 12 point ROS discussed and pertinent positives are indicated in the HPI above.  All other systems are negative.  Review of Systems  Constitutional:  Positive for fatigue. Negative for appetite change.  Respiratory:  Negative for cough and shortness of breath.   Cardiovascular:  Positive for leg swelling. Negative for chest pain.  Gastrointestinal:  Negative for abdominal pain, diarrhea, nausea and vomiting.  Musculoskeletal:  Positive for arthralgias and back pain.  Neurological:  Negative for dizziness and headaches.   Vital Signs: BP (!) 141/59 (BP Location: Right Arm)   Pulse 83   Temp 97.8 F (36.6 C) (Oral)   Resp 16   Ht _0  (1.727 m)   Wt 272 lb (123.4 kg)   SpO2 94%   BMI 41.36 kg/m   Physical Exam Constitutional:      General: He is not in acute distress.    Appearance: He is obese.  HENT:     Mouth/Throat:     Mouth: Mucous membranes are moist.     Pharynx: Oropharynx is clear.  Cardiovascular:     Rate and Rhythm: Normal rate and regular rhythm.     Pulses: Normal pulses.     Heart sounds: Normal heart sounds.  Pulmonary:     Effort: Pulmonary effort is normal.     Breath sounds: Normal breath sounds.  Abdominal:     General: Abdomen is protuberant. Bowel sounds are normal.     Palpations: Abdomen is soft.  Musculoskeletal:        General: Tenderness present.     Right lower leg: Edema present.     Left lower leg: Edema present.     Comments: Low back and right hip pain   Skin:    Comments: Bilateral lower extremities with reddish/brown discoloration; dry with scattered scabs.   Neurological:     Mental Status: He is alert and oriented to person, place, and time.    Imaging: DG  Chest 2 View  Result Date: 08/14/2020 CLINICAL DATA:  Hypoxia EXAM: CHEST - 2 VIEW COMPARISON:  07/16/2020 FINDINGS: Heart size upper normal. Negative for heart failure. Diffuse pleural thickening on the right is chronic and unchanged. Calcified pleural plaque on the right. Negative for infiltrate or effusion. IMPRESSION: Chronic right pleural thickening unchanged. No superimposed acute abnormality. Electronically Signed   By: Franchot Gallo M.D.   On: 08/14/2020 11:05   MR LUMBAR SPINE WO CONTRAST  Result Date: 08/14/2020 CLINICAL DATA:  Right low back pain and right hip pain. Anemia and thrombocytopenia. EXAM: MRI LUMBAR SPINE WITHOUT CONTRAST TECHNIQUE: Multiplanar, multisequence MR imaging  of the lumbar spine was performed. No intravenous contrast was administered. COMPARISON:  MRI lumbar spine 11/15/2018 FINDINGS: Segmentation:  Normal Alignment:  Normal Vertebrae:  Negative for fracture. Bone marrow is diffusely abnormal and very low signal on T1 and T2. This has developed since 2020. No focal lesion identified in the bone marrow. Conus medullaris and cauda equina: Conus extends to the L1-2 level. Conus and cauda equina appear normal. Paraspinal and other soft tissues: Negative for paraspinous mass or adenopathy Disc levels: L1-2: Mild degenerative change.  Negative for stenosis L2-3: Disc bulging and bilateral facet hypertrophy. Mild to moderate spinal stenosis. Mild subarticular stenosis bilaterally. No interval change. L3-4: Disc degeneration with diffuse disc bulging and endplate spurring. This is asymmetric to the right and similar to the prior study. Bilateral facet degeneration. Mild spinal stenosis. Moderate subarticular stenosis bilaterally unchanged. L4-5: Disc degeneration with disc bulging and diffuse endplate spurring. Small annular fissure on the right with small disc protrusion unchanged from the prior study. Bilateral facet degeneration. Mild subarticular stenosis bilaterally. L5-S1:  Right-sided disc protrusion has progressed with right S1 nerve root impingement IMPRESSION: Diffusely abnormal bone marrow which has developed since 2020. Possible myeloproliferative disorder. No fracture Right-sided disc protrusion L5-S1 with mild progression since 2020. There is flattening and displacement of the right S1 nerve root. Multilevel degenerative change throughout the lumbar spine otherwise similar to the prior MRI. Electronically Signed   By: Franchot Gallo M.D.   On: 08/14/2020 17:39   US Abdomen Complete  Result Date: 08/15/2020 CLINICAL DATA:  Hepatomegaly EXAM: ABDOMEN ULTRASOUND COMPLETE COMPARISON:  CT 11/16/2011 FINDINGS: Gallbladder: Diffuse gallbladder wall thickening measuring approximately 6.5 mm. No gallstones or pericholecystic fluid visualized. No sonographic Murphy sign noted by sonographer. Common bile duct: Diameter: 3 mm. Liver: Liver is enlarged measuring at least 22 cm in length. Nodular hepatic surface contour. No focal lesion identified. Within normal limits in parenchymal echogenicity. Portal vein is patent on color Doppler imaging with normal direction of blood flow towards the liver. IVC: No abnormality visualized. Pancreas: Visualized portion unremarkable. Spleen: Appearance is within normal limits. Spleen measures 12.1 cm in length. Right Kidney: Length: 12.2 cm. Echogenicity within normal limits. No mass or hydronephrosis visualized. Left Kidney: Length: 12.6 cm. Echogenicity within normal limits. No mass or hydronephrosis visualized. Abdominal aorta: No aneurysm visualized. Other findings: Small volume perihepatic ascites. IMPRESSION: 1. Hepatomegaly. Nodular hepatic surface contour suggestive of cirrhosis. Correlate with liver function tests. 2. Small volume perihepatic ascites. 3. Spleen measures upper limits of normal in size. 4. Diffuse gallbladder wall thickening which may be secondary to intrinsic liver disease. No cholelithiasis and no sonographic Murphy sign to  suggest cholecystitis. Electronically Signed   By: Davina Poke D.O.   On: 08/15/2020 17:20   NM Myocar Multi W/Spect W/Wall Motion / EF  Result Date: 07/17/2020  There was no ST segment deviation noted during stress.  The study is normal with no evidence of ischemia.  This is a low risk study.  The left ventricular ejection fraction is hyperdynamic (>65%).     Labs:  CBC: Recent Labs    07/17/20 0611 08/14/20 1023 08/15/20 0813 08/16/20 0937  WBC 4.2 2.3* 2.0* 2.1*  HGB 16.3 11.5* 11.0* 11.2*  HCT 48.0 34.2* 32.4* 32.4*  PLT 104* 41* 39* 38*    COAGS: Recent Labs    08/14/20 1211 08/15/20 0813 08/15/20 1030  INR 1.0  --  1.1  APTT  --  29 33    BMP: Recent Labs  07/17/20 8208 08/14/20 1023 08/15/20 0813 08/16/20 0657 08/16/20 0937  NA 133* 137 136  --  135  K 4.4 3.4* 3.9  --  3.5  CL 95* 93* 100  --  97*  CO2 _0 --  27  GLUCOSE 124* 129* 133*  --  198*  BUN _1 --  12  CALCIUM 8.8* 8.4* 8.2*  --  8.3*  CREATININE 0.56* 0.60* 0.48* 0.46* 0.55*  GFRNONAA >60 >60 >60 >60 >60    LIVER FUNCTION TESTS: Recent Labs    07/16/20 0946 08/14/20 1023  BILITOT 0.9 1.0  AST 20 23  ALT 20 13  ALKPHOS 68 89  PROT 7.5 6.8  ALBUMIN 3.4* 2.4*    TUMOR MARKERS: No results for input(s): AFPTM, CEA, CA199, CHROMGRNA in the last 8760 hours.  Assessment and Plan:  Abnormal bone marrow on imaging; leukopenia: Gregg Williams, 65 year old male, is tentatively scheduled for 08/17/20 for an image-guided bone marrow biopsy with aspiration. He will be NPO at midnight; AM labs are ordered.   Risks and benefits of a bone marrow biopsy were discussed with the patient and/or patient's family including, but not limited to bleeding, infection, damage to adjacent structures or low yield requiring additional tests.  All of the questions were answered and there is agreement to proceed.  Consent signed and in chart.  Thank you for this interesting consult.   I greatly enjoyed meeting Gregg Williams and look forward to participating in their care.  A copy of this report was sent to the requesting provider on this date.  Electronically Signed: Soyla Dryer, AGACNP-BC [phone] 08/16/2020, 11:11 AM   I spent a total of 20 Minutes    in face to face in clinical consultation, greater than 50% of which was counseling/coordinating care for bone marrow biopsy with aspiration.

## 2020-08-17 ENCOUNTER — Encounter: Payer: Self-pay | Admitting: Internal Medicine

## 2020-08-17 DIAGNOSIS — I471 Supraventricular tachycardia: Secondary | ICD-10-CM | POA: Diagnosis not present

## 2020-08-17 DIAGNOSIS — I5033 Acute on chronic diastolic (congestive) heart failure: Secondary | ICD-10-CM | POA: Diagnosis not present

## 2020-08-17 DIAGNOSIS — E1169 Type 2 diabetes mellitus with other specified complication: Secondary | ICD-10-CM | POA: Diagnosis not present

## 2020-08-17 DIAGNOSIS — I272 Pulmonary hypertension, unspecified: Secondary | ICD-10-CM | POA: Diagnosis not present

## 2020-08-17 LAB — CBC WITH DIFFERENTIAL/PLATELET
Abs Immature Granulocytes: 0.05 10*3/uL (ref 0.00–0.07)
Basophils Absolute: 0 10*3/uL (ref 0.0–0.1)
Basophils Relative: 1 %
Eosinophils Absolute: 0 10*3/uL (ref 0.0–0.5)
Eosinophils Relative: 1 %
HCT: 31.7 % — ABNORMAL LOW (ref 39.0–52.0)
Hemoglobin: 10.9 g/dL — ABNORMAL LOW (ref 13.0–17.0)
Immature Granulocytes: 2 %
Lymphocytes Relative: 36 %
Lymphs Abs: 0.8 10*3/uL (ref 0.7–4.0)
MCH: 30.9 pg (ref 26.0–34.0)
MCHC: 34.4 g/dL (ref 30.0–36.0)
MCV: 89.8 fL (ref 80.0–100.0)
Monocytes Absolute: 0.2 10*3/uL (ref 0.1–1.0)
Monocytes Relative: 8 %
Neutro Abs: 1.2 10*3/uL — ABNORMAL LOW (ref 1.7–7.7)
Neutrophils Relative %: 52 %
Platelets: 40 10*3/uL — ABNORMAL LOW (ref 150–400)
RBC: 3.53 MIL/uL — ABNORMAL LOW (ref 4.22–5.81)
RDW: 14.3 % (ref 11.5–15.5)
Smear Review: UNDETERMINED
WBC: 2.3 10*3/uL — ABNORMAL LOW (ref 4.0–10.5)
nRBC: 3.4 % — ABNORMAL HIGH (ref 0.0–0.2)

## 2020-08-17 LAB — GLUCOSE, CAPILLARY
Glucose-Capillary: 184 mg/dL — ABNORMAL HIGH (ref 70–99)
Glucose-Capillary: 202 mg/dL — ABNORMAL HIGH (ref 70–99)
Glucose-Capillary: 180 mg/dL — ABNORMAL HIGH (ref 70–99)
Glucose-Capillary: 184 mg/dL — ABNORMAL HIGH (ref 70–99)
Glucose-Capillary: 159 mg/dL — ABNORMAL HIGH (ref 70–99)

## 2020-08-17 LAB — BASIC METABOLIC PANEL
Anion gap: 10 (ref 5–15)
BUN: 11 mg/dL (ref 8–23)
CO2: 29 mmol/L (ref 22–32)
Calcium: 8.4 mg/dL — ABNORMAL LOW (ref 8.9–10.3)
Chloride: 95 mmol/L — ABNORMAL LOW (ref 98–111)
Creatinine, Ser: 0.41 mg/dL — ABNORMAL LOW (ref 0.61–1.24)
GFR, Estimated: >60 mL/min (ref >60)
Glucose, Bld: 166 mg/dL — ABNORMAL HIGH (ref 70–99)
Potassium: 3.7 mmol/L (ref 3.5–5.1)
Sodium: 134 mmol/L — ABNORMAL LOW (ref 135–145)

## 2020-08-17 MED ORDER — FUROSEMIDE 10 MG/ML IJ SOLN
40.0000 mg | Freq: Two times a day (BID) | INTRAMUSCULAR | Status: DC
  Administered 2020-08-17 – 2020-08-19 (×6): 40 mg via INTRAVENOUS
  Filled 2020-08-17 (×6): qty 4

## 2020-08-17 MED ORDER — DILTIAZEM HCL-DEXTROSE 125-5 MG/125ML-% IV SOLN (PREMIX)
5.0000 mg/h | INTRAVENOUS | Status: DC
  Filled 2020-08-17: qty 125

## 2020-08-17 MED ORDER — TRAZODONE HCL 50 MG PO TABS
50.0000 mg | ORAL_TABLET | Freq: Every evening | ORAL | Status: DC | PRN
  Administered 2020-08-17: 50 mg via ORAL
  Filled 2020-08-17: qty 1

## 2020-08-17 MED ORDER — POLYETHYLENE GLYCOL 3350 17 G PO PACK
17.0000 g | PACK | Freq: Two times a day (BID) | ORAL | Status: DC
  Administered 2020-08-17: 17 g via ORAL
  Filled 2020-08-17 (×2): qty 1

## 2020-08-17 MED ORDER — METOPROLOL TARTRATE 5 MG/5ML IV SOLN
5.0000 mg | Freq: Once | INTRAVENOUS | Status: AC
  Administered 2020-08-17: 5 mg via INTRAVENOUS
  Filled 2020-08-17: qty 5

## 2020-08-17 MED ORDER — LIDOCAINE 5 % EX PTCH
1.0000 | MEDICATED_PATCH | CUTANEOUS | Status: DC
  Administered 2020-08-17 – 2020-08-20 (×4): 1 via TRANSDERMAL
  Filled 2020-08-17 (×6): qty 1

## 2020-08-17 MED ORDER — METOPROLOL TARTRATE 5 MG/5ML IV SOLN
5.0000 mg | Freq: Four times a day (QID) | INTRAVENOUS | Status: DC | PRN
  Administered 2020-08-19 (×2): 5 mg via INTRAVENOUS
  Filled 2020-08-17 (×2): qty 5

## 2020-08-17 MED ORDER — DILTIAZEM LOAD VIA INFUSION
20.0000 mg | Freq: Once | INTRAVENOUS | Status: DC
  Filled 2020-08-17: qty 20

## 2020-08-17 MED ORDER — CARVEDILOL 12.5 MG PO TABS
12.5000 mg | ORAL_TABLET | Freq: Two times a day (BID) | ORAL | Status: DC
  Administered 2020-08-17 – 2020-08-19 (×5): 12.5 mg via ORAL
  Filled 2020-08-17 (×5): qty 1

## 2020-08-17 MED ORDER — OXYCODONE-ACETAMINOPHEN 5-325 MG PO TABS
1.0000 | ORAL_TABLET | ORAL | Status: DC | PRN
  Administered 2020-08-17 – 2020-08-18 (×4): 2 via ORAL
  Filled 2020-08-17 (×4): qty 2

## 2020-08-17 NOTE — Progress Notes (Addendum)
   08/17/20 0954  Assess: MEWS Score  Temp 98.2 F (36.8 C)  BP (!) 178/92  Pulse Rate (!) 190  Resp (!) 22  SpO2 93 %  O2 Device Nasal Cannula  O2 Flow Rate (L/min) 2 L/min  Assess: MEWS Score  MEWS Temp 0  MEWS Systolic 0  MEWS Pulse 3  MEWS RR 1  MEWS LOC 0  MEWS Score 4  MEWS Score Color Red  Assess: if the MEWS score is Yellow or Red  Were vital signs taken at a resting state? Yes  Focused Assessment Change from prior assessment (see assessment flowsheet)  Does the patient meet 2 or more of the SIRS criteria? No  MEWS guidelines implemented *See Row Information* Yes  Take Vital Signs  Increase Vital Sign Frequency  Red: Q 1hr X 4 then Q 4hr X 4, if remains red, continue Q 4hrs  Escalate  MEWS: Escalate Red: discuss with charge nurse/RN and provider, consider discussing with RRT  Notify: Charge Nurse/RN  Name of Charge Nurse/RN Notified Coolidge Breeze, RN  Date Charge Nurse/RN Notified 08/17/20  Time Charge Nurse/RN Notified [phone]  Notify: Provider  Provider Name/Title Dr. Eulogio Bear  Date Provider Notified 08/17/20  Time Provider Notified [phone]  Notification Type Page  Notification Reason Change in status  Provider response At bedside;See new orders  Date of Provider Response 08/17/20  Time of Provider Response 1000  Document  Patient Outcome Other (Comment) (Given IV lopressor and PO Coreg)  Progress note created (see row info) Yes  Assess: SIRS CRITERIA  SIRS Temperature  0  SIRS Pulse 1  SIRS Respirations  1  SIRS WBC 0  SIRS Score Sum  2  EKG was ordered along with IV Lopressor and PO Coreg & MD at the bedside Rapid response not called per MD

## 2020-08-17 NOTE — Progress Notes (Signed)
   08/17/20 0954  Assess: MEWS Score  Temp 98.2 F (36.8 C)  BP (!) 178/92  Pulse Rate (!) 190  Resp (!) 22  SpO2 93 %  O2 Device Nasal Cannula  O2 Flow Rate (L/min) 2 L/min  Assess: MEWS Score  MEWS Temp 0  MEWS Systolic 0  MEWS Pulse 3  MEWS RR 1  MEWS LOC 0  MEWS Score 4  MEWS Score Color Red  Assess: if the MEWS score is Yellow or Red  Were vital signs taken at a resting state? Yes  Focused Assessment Change from prior assessment (see assessment flowsheet)  Does the patient meet 2 or more of the SIRS criteria? No  MEWS guidelines implemented *See Row Information* Yes  Treat  MEWS Interventions Administered scheduled meds/treatments;Escalated (See documentation below)  Take Vital Signs  Increase Vital Sign Frequency  Red: Q 1hr X 4 then Q 4hr X 4, if remains red, continue Q 4hrs  Escalate  MEWS: Escalate Red: discuss with charge nurse/RN and provider, consider discussing with RRT  Notify: Charge Nurse/RN  Name of Charge Nurse/RN Notified Coolidge Breeze, RN  Date Charge Nurse/RN Notified 08/17/20  Time Charge Nurse/RN Notified [phone]  Notify: Provider  Provider Name/Title Dr. Eulogio Bear  Date Provider Notified 08/17/20  Time Provider Notified [phone]  Notification Type Page  Notification Reason Change in status  Provider response At bedside;See new orders  Date of Provider Response 08/17/20  Time of Provider Response 1000  Document  Patient Outcome Other (Comment);Transferred/level of care increased (Given IV lopressor and PO Coreg Orders received to transfered)  Progress note created (see row info) Yes  Assess: SIRS CRITERIA  SIRS Temperature  0  SIRS Pulse 1  SIRS Respirations  1  SIRS WBC 0  SIRS Score Sum  2

## 2020-08-17 NOTE — Procedures (Signed)
Interventional Radiology Note   Patient referred for image guided bone marrow bx.   This am has some vital sign lability.  Pending cardiology evaluation.    Discussed with Dr. Eliseo Squires.    We will defer the BMBX today, tentatively plan for tomorrow.   Signed,  Dulcy Fanny. Earleen Newport, DO

## 2020-08-17 NOTE — Progress Notes (Signed)
Hematology/Oncology Inpatient Progress Note [facility]  Telephone:([phone]) [phone] Fax:(336) [phone]   Gregg Williams   DOB:04-Sep-1955   XB#:[id number]   ZHY#:[id number]  HPI: Patient is 65 year old male with medical history significant for HTN, HLD, pulmonary hypertension, on oxygen at home, CAD, obesity, BMI 41, diabetes, thrombocytopenia, chronic back pain who is currently admitted for acutely worse lower back pain. He was found to be pancytopenic.   Interval History Continues to be admitted. Plan was for bone marrow biopsy this morning but was postponed to tomorrow 6/22 due to atrial tachycardia and worsening dyspnea early this morning. He has been seen by cardiology. He feels better this evening. Tired. Back pain continues to bother him.    Objective:  Vitals:   08/17/20 1438 08/17/20 1542  BP: 139/64 122/65  Pulse: 90 90  Resp: (!) 22 18  Temp: 97.7 F (36.5 C) (!) 97.5 F (36.4 C)  SpO2: 90% 96%    Body mass index is 41.36 kg/m. No intake or output data in the 24 hours ending 08/17/20 1712   Laying in hospital bed  Sclerae unicteric  Oropharynx shows no thrush or other lesions  No cervical or supraclavicular adenopathy  Lungs no rales or wheezes--auscultated anterolaterally  Heart regular rate and rhythm  Abdomen rounded; obese  Neuro nonfocal    CBG (last 3)  Recent Labs    08/17/20 0922 08/17/20 1147 08/17/20 1635  GLUCAP 202* 180* 184*     Labs:  Lab Results  Component Value Date   WBC 2.3 (L) 08/17/2020   HGB 10.9 (L) 08/17/2020   HCT 31.7 (L) 08/17/2020   MCV 89.8 08/17/2020   PLT 40 (L) 08/17/2020   NEUTROABS 1.2 (L) 08/17/2020   Urine Studies No results for input(s): UHGB, CRYS in the last 72 hours.  Invalid input(s): UACOL, UAPR, USPG, UPH, UTP, UGL, [place], UBIL, UNIT, UROB, [place], UEPI, UWBC, [place], [place], [place], [place], [place]  Basic Metabolic Panel: Recent Labs  Lab 08/14/20 1023 08/14/20 1211 08/15/20 0813 08/16/20 0657  08/16/20 0937 08/17/20 0442  NA 137  --  136  --  135 134*  K 3.4*  --  3.9  --  3.5 3.7  CL 93*  --  100  --  97* 95*  CO2 31  --  26  --  27 29  GLUCOSE 129*  --  133*  --  198* 166*  BUN 14  --  12  --  12 11  CREATININE 0.60*  --  0.48* 0.46* 0.55* 0.41*  CALCIUM 8.4*  --  8.2*  --  8.3* 8.4*  MG  --  2.1  --   --   --   --    GFR Estimated Creatinine Clearance: 122.4 mL/min (A) (by C-G formula based on SCr of 0.41 mg/dL (L)). Liver Function Tests: Recent Labs  Lab 08/14/20 1023  AST 23  ALT 13  ALKPHOS 89  BILITOT 1.0  PROT 6.8  ALBUMIN 2.4*   No results for input(s): LIPASE, AMYLASE in the last 168 hours. No results for input(s): AMMONIA in the last 168 hours. Coagulation profile Recent Labs  Lab 08/14/20 1211 08/15/20 1030  INR 1.0 1.1    CBC: Recent Labs  Lab 08/14/20 1023 08/15/20 0813 08/16/20 0937 08/17/20 0442  WBC 2.3* 2.0* 2.1* 2.3*  NEUTROABS 1.2*  --   --  1.2*  HGB 11.5* 11.0* 11.2* 10.9*  HCT 34.2* 32.4* 32.4* 31.7*  MCV 90.7 91.3 88.8 89.8  PLT 41* 39* 38* 40*   Cardiac Enzymes: No results for input(s): CKTOTAL, CKMB, CKMBINDEX, TROPONINI in the last 168 hours. BNP: Invalid input(s): POCBNP CBG: Recent Labs  Lab 08/16/20 2118 08/17/20 0751 08/17/20 0922 08/17/20 1147 08/17/20 1635  GLUCAP 153* 184* 202* 180* 184*   D-Dimer No results for input(s): DDIMER in the last 72 hours. Hgb A1c No results for input(s): HGBA1C in the last 72 hours. Lipid Profile No results for input(s): CHOL, HDL, LDLCALC, TRIG, CHOLHDL, LDLDIRECT in the last 72 hours. Thyroid function studies No results for input(s): TSH, T4TOTAL, T3FREE, THYROIDAB in the last 72 hours.  Invalid input(s): FREET3 Anemia work up [employer]    08/15/20 0814 08/15/20 0817  VITAMINB12  --  167*  FERRITIN 1,590*  --   TIBC 182*  --   IRON 78  --    Microbiology Recent Results (from the past 240 hour(s))  Culture, blood (routine x 2)     Status: None (Preliminary  result)   Collection Time: 08/14/20  2:19 PM   Specimen: BLOOD  Result Value Ref Range Status   Specimen Description BLOOD RIGHT ANTECUBITAL  Final   Special Requests   Final    BOTTLES DRAWN AEROBIC AND ANAEROBIC Blood Culture adequate volume   Culture   Final    NO GROWTH 3 DAYS Performed at [facility], [address] [place], [place] [address]    Report Status PENDING  Incomplete  Culture, blood (routine x 2)     Status: None (Preliminary result)   Collection Time: 08/14/20  2:25 PM   Specimen: BLOOD  Result Value Ref Range Status   Specimen Description BLOOD RIGHT HAND  Final   Special Requests   Final    BOTTLES DRAWN AEROBIC AND ANAEROBIC Blood Culture adequate volume   Culture   Final    NO GROWTH 3 DAYS Performed at [facility], [address] [place], [place] [address]    Report Status PENDING  Incomplete  SARS CORONAVIRUS 2 (TAT 6-24 HRS) Nasopharyngeal Nasopharyngeal Swab     Status: None   Collection Time: 08/14/20  3:46 PM   Specimen: Nasopharyngeal Swab  Result Value Ref Range Status   SARS Coronavirus 2 NEGATIVE NEGATIVE Final    Comment: (NOTE) SARS-CoV-2 target nucleic acids are NOT DETECTED.  The SARS-CoV-2 RNA is generally detectable in upper and lower respiratory specimens during the acute phase of infection. Negative results do not preclude SARS-CoV-2 infection, do not rule out co-infections with other pathogens, and should not be used as the sole basis for treatment or other patient management decisions. Negative results must be combined with clinical observations, patient history, and epidemiological information. The expected result is Negative.  Fact Sheet for Patients: SugarRoll.be  Fact Sheet for Healthcare Providers: https://www.woods-mathews.com/  This test is not yet approved or cleared by the [place] FDA and  has been authorized for detection and/or diagnosis of  SARS-CoV-2 by FDA under an Emergency Use Authorization (EUA). This EUA will remain  in effect (meaning this test can be used) for the duration of the COVID-19 declaration under Se ction 564(b)(1) of the Act, 21 U.S.C. section 360bbb-3(b)(1), unless the authorization is terminated or revoked sooner.  Performed at [facility], [place] [address], [place], [place] [address]       Studies:  No results found.  Assessment & Plan: 65 y.o. currently admitted to hospitaland found to have new onset pancytopenia. Neutropenic with ANC 1200. Platelets 40,000. Ultrasound  suggestive of cirrhosis. LDH elevated. Peripheral smear concerning for immature cells for workup for possible myeloproliferative neoplasm. Bone marrow biopsy held due to atrial tachycardia. BB restarted. Bone marrow tentatively planned for 6/22. Ok to discharge from heme/onc perspective once patient he has BMB and will plan to follow up with him outpatient. Will continue to follow him through admission.    Verlon Au, NP 08/17/2020  5:12 PM [facility]

## 2020-08-17 NOTE — Progress Notes (Signed)
Progress Note    Gregg Williams  WTG:[id number] DOB: [birth year]  DOA: 08/14/2020 PCP: Kaleen Mask, MD    Brief Narrative:    Medical records reviewed and are as summarized below:  Gregg Williams is an 65 y.o. male with medical history significant of HTN, HLD, pulmonary hypertension on 1.5 L oxygen at home, CAD, obesity, thrombocytopenia, chronic back pain, who presents with lower back pain and abnormal lab. Concern for myeloproliferative disease and BMB pending.  Stay complicated by atrial tachycardia and worsening dyspnea.     Assessment/Plan:   Principal Problem:   Lower back pain Active Problems:   Diabetes mellitus without complication (HCC)   Thrombocytopenia (HCC)   Pulmonary hypertension (HCC)   Chronic respiratory failure with hypoxia (HCC)   Hypokalemia   CAD (coronary artery disease)   SIRS (systemic inflammatory response syndrome) (HCC)   Obesity, Class III, BMI 40-49.9 (morbid obesity) (HCC)   Pancytopenia (HCC)   Lower back pain: Patient states that he has chronic low back pain for more than 6 years.  Patient had spine surgery in the past.   -past several weeks, patient has progressively worsening back pain.  The back pain is radiating to the right leg laterally.  Also complains of a mild right leg weakness.  No loss control of bladder or bowel movement.   -elevated inflammatory marker in PCPs office, with alk phos in the 140s, CRP of 30, and sed rate of 130.   - MRI-L spine negative for spinal cord compression, but showed possible myeloproliferative disorder. Pt has WBC 2.3 and thrombocytopenia. -oncology plans for BMB- postponed on 6/21 to 6/22 due to atrial tachycardiac -bowel regimen   Atrial tachycardia -resume BB -PRN IV BB -tsh pending -cards consult -? Need to get echo as prior was never read  SIRS (systemic inflammatory response syndrome) [facility]): Patient meets criteria for SIRS with tachycardia with heart rate up to 92 and WBC 2.3  (<4.0), no source of infection identified -cultures negative -d/c abx and follow while in hospital   Chronic respiratory failure: -on 1.5-2L [place] -sent home on O2 last admission for ? Pulmonary HTN  Diabetes mellitus without complication [facility]): Recent A1c 7.1, poorly controlled.  Patient is taking Jardiance and Amaryl. -SSI   Thrombocytopenia (HCC): This is chronic issue.  Platelet 104 on 07/17/2020 --> 41.   -plan for BMB when stable   Presumed Pulmonary hypertension and chronic respiratory failure with hypoxia [facility]): Currently patient is on home level 1.5-2 L oxygen, saturation 93-95%.  No worsening shortness of breath. -Bronchodilators -echo pending read from last admission -? Repeat echo   Hypokalemia: K=  3.4 on admission. - Repleted   CAD (coronary artery disease):  -ASA -pt is not taking crestor   HLD: pt is not taking his Crestor -defer to PCP   Obesity, Class III, BMI 40-49.9 (morbid obesity) (HCC): Estimated body mass index is 41.36 kg/m as calculated from the following:   Height as of this encounter: 5\' 8"  (1.727 m).   Weight as of this encounter: 123.4 kg.  Body mass index is 41.36 kg/m.   Family Communication/Anticipated D/C date and plan/Code Status   DVT prophylaxis: scd Code Status: Full Code.  Disposition Plan: Status is: inpt   Dispo: The patient is from: Home              Anticipated d/c is to: Home              Patient currently is not  medically stable to d/c. Getting BMB   Difficult to place patient No         Medical Consultants:   Oncology cards    Subjective:   Did not sleep well last PM   Objective:    Vitals:   08/17/20 0753 08/17/20 0954 08/17/20 1059 08/17/20 1151  BP: (!) 141/72 (!) 178/92 (!) 122/59 111/69  Pulse: 78 (!) 190 93 91  Resp: 20 (!) 22 18 (!) 22  Temp: 97.7 F (36.5 C) 98.2 F (36.8 C) 99.1 F (37.3 C) 97.9 F (36.6 C)  TempSrc: Oral Oral Oral Oral  SpO2: 93% 93% 96% 90%  Weight:      Height:         Intake/Output Summary (Last 24 hours) at 08/17/2020 1307 Last data filed at 08/16/2020 1700 Gross per 24 hour  Intake 480 ml  Output --  Net 480 ml   Filed Weights   08/14/20 1008  Weight: 123.4 kg    Exam:   General: Appearance:    Severely obese male in mild distress   +BS, hypoactive, round abdomen  Lungs:     Diminished, no wheezing  Heart:    HR fast and feels irregular   MS:   All extremities are intact.    Neurologic:   Awake, alert, able to relay history            Data Reviewed:   I have personally reviewed following labs and imaging studies:  Labs: Labs show the following:   Basic Metabolic Panel: Recent Labs  Lab 08/14/20 1023 08/14/20 1211 08/15/20 0813 08/16/20 0657 08/16/20 0937 08/17/20 0442  NA 137  --  136  --  135 134*  K 3.4*  --  3.9  --  3.5 3.7  CL 93*  --  100  --  97* 95*  CO2 31  --  26  --  27 29  GLUCOSE 129*  --  133*  --  198* 166*  BUN 14  --  12  --  12 11  CREATININE 0.60*  --  0.48* 0.46* 0.55* 0.41*  CALCIUM 8.4*  --  8.2*  --  8.3* 8.4*  MG  --  2.1  --   --   --   --    GFR Estimated Creatinine Clearance: 122.4 mL/min (A) (by C-G formula based on SCr of 0.41 mg/dL (L)). Liver Function Tests: Recent Labs  Lab 08/14/20 1023  AST 23  ALT 13  ALKPHOS 89  BILITOT 1.0  PROT 6.8  ALBUMIN 2.4*   No results for input(s): LIPASE, AMYLASE in the last 168 hours. No results for input(s): AMMONIA in the last 168 hours. Coagulation profile Recent Labs  Lab 08/14/20 1211 08/15/20 1030  INR 1.0 1.1    CBC: Recent Labs  Lab 08/14/20 1023 08/15/20 0813 08/16/20 0937 08/17/20 0442  WBC 2.3* 2.0* 2.1* 2.3*  NEUTROABS 1.2*  --   --  1.2*  HGB 11.5* 11.0* 11.2* 10.9*  HCT 34.2* 32.4* 32.4* 31.7*  MCV 90.7 91.3 88.8 89.8  PLT 41* 39* 38* 40*   Cardiac Enzymes: No results for input(s): CKTOTAL, CKMB, CKMBINDEX, TROPONINI in the last 168 hours. BNP (last 3 results) No results for input(s): PROBNP in the last  8760 hours. CBG: Recent Labs  Lab 08/16/20 1732 08/16/20 2118 08/17/20 0751 08/17/20 0922 08/17/20 1147  GLUCAP 148* 153* 184* 202* 180*   D-Dimer: No results for input(s): DDIMER in the last 72 hours. Hgb  A1c: No results for input(s): HGBA1C in the last 72 hours. Lipid Profile: No results for input(s): CHOL, HDL, LDLCALC, TRIG, CHOLHDL, LDLDIRECT in the last 72 hours. Thyroid function studies: No results for input(s): TSH, T4TOTAL, T3FREE, THYROIDAB in the last 72 hours.  Invalid input(s): FREET3 Anemia work up: Recent Labs    08/15/20 0814 08/15/20 0817  VITAMINB12  --  167*  FERRITIN 1,590*  --   TIBC 182*  --   IRON 78  --    Sepsis Labs: Recent Labs  Lab 08/14/20 1023 08/14/20 1211 08/14/20 1248 08/14/20 1448 08/14/20 1750 08/15/20 0813 08/16/20 0937 08/17/20 0442  PROCALCITON  --  0.38  --   --   --   --  0.34  --   WBC 2.3*  --   --   --   --  2.0* 2.1* 2.3*  LATICACIDVEN  --   --  2.6* 2.2* 2.1* 1.9  --   --     Microbiology Recent Results (from the past 240 hour(s))  Culture, blood (routine x 2)     Status: None (Preliminary result)   Collection Time: 08/14/20  2:19 PM   Specimen: BLOOD  Result Value Ref Range Status   Specimen Description BLOOD RIGHT ANTECUBITAL  Final   Special Requests   Final    BOTTLES DRAWN AEROBIC AND ANAEROBIC Blood Culture adequate volume   Culture   Final    NO GROWTH 3 DAYS Performed at [facility], [address] [place], [place] [address]    Report Status PENDING  Incomplete  Culture, blood (routine x 2)     Status: None (Preliminary result)   Collection Time: 08/14/20  2:25 PM   Specimen: BLOOD  Result Value Ref Range Status   Specimen Description BLOOD RIGHT HAND  Final   Special Requests   Final    BOTTLES DRAWN AEROBIC AND ANAEROBIC Blood Culture adequate volume   Culture   Final    NO GROWTH 3 DAYS Performed at [facility], [address] [place], [place] [address]    Report  Status PENDING  Incomplete  SARS CORONAVIRUS 2 (TAT 6-24 HRS) Nasopharyngeal Nasopharyngeal Swab     Status: None   Collection Time: 08/14/20  3:46 PM   Specimen: Nasopharyngeal Swab  Result Value Ref Range Status   SARS Coronavirus 2 NEGATIVE NEGATIVE Final    Comment: (NOTE) SARS-CoV-2 target nucleic acids are NOT DETECTED.  The SARS-CoV-2 RNA is generally detectable in upper and lower respiratory specimens during the acute phase of infection. Negative results do not preclude SARS-CoV-2 infection, do not rule out co-infections with other pathogens, and should not be used as the sole basis for treatment or other patient management decisions. Negative results must be combined with clinical observations, patient history, and epidemiological information. The expected result is Negative.  Fact Sheet for Patients: SugarRoll.be  Fact Sheet for Healthcare Providers: https://www.woods-mathews.com/  This test is not yet approved or cleared by the [place] FDA and  has been authorized for detection and/or diagnosis of SARS-CoV-2 by FDA under an Emergency Use Authorization (EUA). This EUA will remain  in effect (meaning this test can be used) for the duration of the COVID-19 declaration under Se ction 564(b)(1) of the Act, 21 U.S.C. section 360bbb-3(b)(1), unless the authorization is terminated or revoked sooner.  Performed at [facility], [place] [address] [place], [place] [address]     Procedures and diagnostic studies:  US Abdomen Complete  Result  Date: 08/15/2020 CLINICAL DATA:  Hepatomegaly EXAM: ABDOMEN ULTRASOUND COMPLETE COMPARISON:  CT 11/16/2011 FINDINGS: Gallbladder: Diffuse gallbladder wall thickening measuring approximately 6.5 mm. No gallstones or pericholecystic fluid visualized. No sonographic Murphy sign noted by sonographer. Common bile duct: Diameter: 3 mm. Liver: Liver is enlarged measuring at least 22 cm in length.  Nodular hepatic surface contour. No focal lesion identified. Within normal limits in parenchymal echogenicity. Portal vein is patent on color Doppler imaging with normal direction of blood flow towards the liver. IVC: No abnormality visualized. Pancreas: Visualized portion unremarkable. Spleen: Appearance is within normal limits. Spleen measures 12.1 cm in length. Right Kidney: Length: 12.2 cm. Echogenicity within normal limits. No mass or hydronephrosis visualized. Left Kidney: Length: 12.6 cm. Echogenicity within normal limits. No mass or hydronephrosis visualized. Abdominal aorta: No aneurysm visualized. Other findings: Small volume perihepatic ascites. IMPRESSION: 1. Hepatomegaly. Nodular hepatic surface contour suggestive of cirrhosis. Correlate with liver function tests. 2. Small volume perihepatic ascites. 3. Spleen measures upper limits of normal in size. 4. Diffuse gallbladder wall thickening which may be secondary to intrinsic liver disease. No cholelithiasis and no sonographic Murphy sign to suggest cholecystitis. Electronically Signed   By: Davina Poke D.O.   On: 08/15/2020 17:20    Medications:    aspirin EC  81 mg Oral Daily   carvedilol  12.5 mg Oral BID WC   cyanocobalamin  1,000 mcg Intramuscular Daily   docusate sodium  100 mg Oral Daily   furosemide  40 mg Intravenous BID   insulin aspart  0-5 Units Subcutaneous QHS   insulin aspart  0-9 Units Subcutaneous TID WC   lidocaine  1 patch Transdermal Q24H   mometasone-formoterol  2 puff Inhalation BID   polyethylene glycol  17 g Oral BID   Continuous Infusions:     LOS: 2 days   Geradine Girt  [facility]   How to contact the [facility] Attending or Consulting provider [place] or covering provider during after hours [place], for this patient?  Check the care team in [facility] and look for a) attending/consulting [facility] provider listed and b) the [facility] team listed Log into [email or web address] and use [place]'s universal password to  access. If you do not have the password, please contact the hospital operator. Locate the [facility] provider you are looking for under [facility] and page to a number that you can be directly reached. If you still have difficulty reaching the provider, please page the [facility] (Director on Call) for the Hospitalists listed on amion for assistance.  08/17/2020, 1:07 PM

## 2020-08-17 NOTE — Consult Note (Signed)
Cardiology Consult    Patient ID: Gregg Williams MRN: [id number], DOB/AGE: [birth year]   Admit date: 08/14/2020 Date of Consult: 08/17/2020  Primary Physician: Leonard Downing, MD Primary Cardiologist: Nelva Bush, MD Requesting Provider: Princella Ion, DO  Patient Profile    Gregg Williams is a 65 y.o. male with a history of obesity, HTN, DMII, chronic resp failure w/ home O2 (PRN), pulmonary hypertension, and chronic back pain, who is being seen today for the evaluation of paroxysmal atrial tachycardia at the request of Dr. Eliseo Squires.  Past Medical History   Past Medical History:  Diagnosis Date   Chest pain    a. 06/2020 MV: EF >65%. No ischemia/infarct-->low risk.   Chronic back pain    Chronic respiratory failure ([place])    a. Has home O2 to be used prn.   Coronary artery calcification seen on CT scan    a. Pt wishes to defer statin rx.   Diabetes mellitus without complication ([place])    Essential hypertension    Morbid obesity ([facility])     Past Surgical History:  Procedure Laterality Date   SPINE SURGERY       Allergies  Allergies  Allergen Reactions   Benadryl [Diphenhydramine] Hives   Metformin     Diarrhea, stomach cramps    History of Present Illness    65 y.o. male with a history of obesity, HTN, DMII, chronic resp failure w/ home O2 (PRN), and chronic back pain.  He previously smoked for 7-8 years but says that he quit many years ago. He lives locally by himself. 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  He has chronic back pain in the setting of degenerative disc dzs s/p prior spinal surgeries.  He was hospitalized @ [facility] in May w/ chest and back pain.  He r/o for MI.  CTA of the chest was neg for PE but notable for dilated central PA suggestive of PAH and coronary and Ao atherosclerosis.  He was seen by our team and underwent stress testing, which was low risk.  He was discharged home on ? blocker and statin rx w/ recommendation for outpt f/u and outpt  sleep study.  Pt says that he f/u w/ his PCP and decided that he didn't need a statin @ this time.  He also hasn't been taking his ? blocker.  Over the past three weeks, he has had progressively worsening mid and low back pain.  He was seen by his PCP w/ labs and xrays done last week and was notified on 6/20 that his labs were abnl (alk phos 140s,CRP 30, ESR 130), and he was advised to present to ED for eval.  He presented to the [facility] ED on 6/18, where he was hypertensive @ 147/124.  O2 sats were 88% on RA.  CXR w/ chronic R pleural thickening.  Labs notable for WBC 2.3, platelets 41, HsTrop 7, lactate 2.6.  MRI of the spine showed diffusely abnormal bone marrow with concern for myeloproliferative disorder.  No fracture.  Right-sided disc retrusion at L5-S1 with mild progression since 2020 and flattening and displacement of the right S1 nerve root.  Multilevel degenerative changes throughout.  He was admitted initially placed on antibiotics out of concern for SIRS, these have since been discontinued.  He has been undergoing evaluation for myeloproliferative disorder and is pending a bone marrow biopsy.  Over the past 24 hours, he has been noted to have frequent PACs and runs of tachycardia  which were initially concerning for A. fib however, on further review, he has been having runs of atrial tachycardia with rates sometimes into the high 180's.  Patient denies any symptoms and further denies any prior history of chest pain or palpitations.  He has some degree of chronic dyspnea on exertion in the setting of being sedentary and also having pulmonary hypertension.  He does have oxygen to be used at home however, he uses this only as needed.  Inpatient Medications     aspirin EC  81 mg Oral Daily   carvedilol  12.5 mg Oral BID WC   cyanocobalamin  1,000 mcg Intramuscular Daily   docusate sodium  100 mg Oral Daily   furosemide  40 mg Intravenous BID   insulin aspart  0-5 Units Subcutaneous QHS   insulin  aspart  0-9 Units Subcutaneous TID WC   lidocaine  1 patch Transdermal Q24H   mometasone-formoterol  2 puff Inhalation BID   polyethylene glycol  17 g Oral BID    Family History    Family History  Problem Relation Age of Onset   Other Father    CAD Father    He indicated that the status of his father is unknown.   Social History    Social History   Socioeconomic History   Marital status: Single    Spouse name: Not on file   Number of children: Not on file   Years of education: Not on file   Highest education level: Not on file  Occupational History   Not on file  Tobacco Use   Smoking status: Former    Pack years: 0.00   Smokeless tobacco: Never   Tobacco comments:    Smoked for 7-8 years but quit "many years ago."  Substance and Sexual Activity   Alcohol use: Not Currently   Drug use: Not Currently   Sexual activity: Not on file  Other Topics Concern   Not on file  Social History Narrative   Not on file   Social Determinants of Health   Financial Resource Strain: Not on file  Food Insecurity: Not on file  Transportation Needs: Not on file  Physical Activity: Not on file  Stress: Not on file  Social Connections: Not on file  Intimate Partner Violence: Not on file     Review of Systems    General:  No chills, fever, night sweats or weight changes.  Cardiovascular:  No chest pain, +++ chronic dyspnea on exertion, +++ mild LE edema, no orthopnea, palpitations, paroxysmal nocturnal dyspnea. Dermatological: No rash, lesions/masses Respiratory: No cough, +++ dyspnea Urologic: No hematuria, dysuria Abdominal:   No nausea, vomiting, diarrhea, bright red blood per rectum, melena, or hematemesis Neurologic:  No visual changes, wkns, changes in mental status. MSK:  chronic lbp All other systems reviewed and are otherwise negative except as noted above.  Physical Exam    Blood pressure 112/65, pulse (!) 103, temperature (!) 97.5 F (36.4 C), resp. rate 18,  height $Remov'5\' 8"'CDJYaS$  (1.727 m), weight 123.4 kg, SpO2 95 %.  General: Pleasant, no acute distress, obese. Psych: Flat affect. Neuro: Alert and oriented X 3. Moves all extremities spontaneously. HEENT: Normal  Neck: Supple, obese, difficult to gauge JVP.  No bruits. Lungs:  Resp regular and unlabored, bibasilar crackles. Heart: RRR no s3, s4, or murmurs. Abdomen: Obese, firm, non-tender, non-distended, BS + x 4.  Extremities: No clubbing, cyanosis.  2+ bilateral lower extremity edema. DP/PT2+, Radials 2+ and equal bilaterally.  Labs  Cardiac Enzymes Recent Labs  Lab 08/14/20 1023 08/14/20 1211  TROPONINIHS 7 7      Lab Results  Component Value Date   WBC 2.3 (L) 08/17/2020   HGB 10.9 (L) 08/17/2020   HCT 31.7 (L) 08/17/2020   MCV 89.8 08/17/2020   PLT 40 (L) 08/17/2020    Recent Labs  Lab 08/14/20 1023 08/15/20 0813 08/17/20 0442  NA 137   < > 134*  K 3.4*   < > 3.7  CL 93*   < > 95*  CO2 31   < > 29  BUN 14   < > 11  CREATININE 0.60*   < > 0.41*  CALCIUM 8.4*   < > 8.4*  PROT 6.8  --   --   BILITOT 1.0  --   --   ALKPHOS 89  --   --   ALT 13  --   --   AST 23  --   --   GLUCOSE 129*   < > 166*   < > = values in this interval not displayed.   Lab Results  Component Value Date   CHOL 127 07/17/2020   HDL 33 (L) 07/17/2020   LDLCALC 71 07/17/2020   TRIG 113 07/17/2020   Lab Results  Component Value Date   DDIMER 0.88 (H) 07/16/2020     Radiology Studies    DG Chest 2 View  Result Date: 08/14/2020 CLINICAL DATA:  Hypoxia EXAM: CHEST - 2 VIEW COMPARISON:  07/16/2020 FINDINGS: Heart size upper normal. Negative for heart failure. Diffuse pleural thickening on the right is chronic and unchanged. Calcified pleural plaque on the right. Negative for infiltrate or effusion. IMPRESSION: Chronic right pleural thickening unchanged. No superimposed acute abnormality. Electronically Signed   By: Franchot Gallo M.D.   On: 08/14/2020 11:05   MR LUMBAR SPINE WO  CONTRAST  Result Date: 08/14/2020 CLINICAL DATA:  Right low back pain and right hip pain. Anemia and thrombocytopenia. EXAM: MRI LUMBAR SPINE WITHOUT CONTRAST TECHNIQUE: Multiplanar, multisequence MR imaging of the lumbar spine was performed. No intravenous contrast was administered. COMPARISON:  MRI lumbar spine 11/15/2018 FINDINGS: Segmentation:  Normal Alignment:  Normal Vertebrae:  Negative for fracture. Bone marrow is diffusely abnormal and very low signal on T1 and T2. This has developed since 2020. No focal lesion identified in the bone marrow. Conus medullaris and cauda equina: Conus extends to the L1-2 level. Conus and cauda equina appear normal. Paraspinal and other soft tissues: Negative for paraspinous mass or adenopathy Disc levels: L1-2: Mild degenerative change.  Negative for stenosis L2-3: Disc bulging and bilateral facet hypertrophy. Mild to moderate spinal stenosis. Mild subarticular stenosis bilaterally. No interval change. L3-4: Disc degeneration with diffuse disc bulging and endplate spurring. This is asymmetric to the right and similar to the prior study. Bilateral facet degeneration. Mild spinal stenosis. Moderate subarticular stenosis bilaterally unchanged. L4-5: Disc degeneration with disc bulging and diffuse endplate spurring. Small annular fissure on the right with small disc protrusion unchanged from the prior study. Bilateral facet degeneration. Mild subarticular stenosis bilaterally. L5-S1: Right-sided disc protrusion has progressed with right S1 nerve root impingement IMPRESSION: Diffusely abnormal bone marrow which has developed since 2020. Possible myeloproliferative disorder. No fracture Right-sided disc protrusion L5-S1 with mild progression since 2020. There is flattening and displacement of the right S1 nerve root. Multilevel degenerative change throughout the lumbar spine otherwise similar to the prior MRI. Electronically Signed   By: Franchot Gallo M.D.   On: 08/14/2020  17:39    US Abdomen Complete  Result Date: 08/15/2020 CLINICAL DATA:  Hepatomegaly EXAM: ABDOMEN ULTRASOUND COMPLETE COMPARISON:  CT 11/16/2011 FINDINGS: Gallbladder: Diffuse gallbladder wall thickening measuring approximately 6.5 mm. No gallstones or pericholecystic fluid visualized. No sonographic Murphy sign noted by sonographer. Common bile duct: Diameter: 3 mm. Liver: Liver is enlarged measuring at least 22 cm in length. Nodular hepatic surface contour. No focal lesion identified. Within normal limits in parenchymal echogenicity. Portal vein is patent on color Doppler imaging with normal direction of blood flow towards the liver. IVC: No abnormality visualized. Pancreas: Visualized portion unremarkable. Spleen: Appearance is within normal limits. Spleen measures 12.1 cm in length. Right Kidney: Length: 12.2 cm. Echogenicity within normal limits. No mass or hydronephrosis visualized. Left Kidney: Length: 12.6 cm. Echogenicity within normal limits. No mass or hydronephrosis visualized. Abdominal aorta: No aneurysm visualized. Other findings: Small volume perihepatic ascites. IMPRESSION: 1. Hepatomegaly. Nodular hepatic surface contour suggestive of cirrhosis. Correlate with liver function tests. 2. Small volume perihepatic ascites. 3. Spleen measures upper limits of normal in size. 4. Diffuse gallbladder wall thickening which may be secondary to intrinsic liver disease. No cholelithiasis and no sonographic Murphy sign to suggest cholecystitis. Electronically Signed   By: Davina Poke D.O.   On: 08/15/2020 17:20    ECG & Cardiac Imaging    Sinus tachycardia, 113, RAD, freq PACs, LAE, poor R progression - personally reviewed.  Assessment & Plan    1.  Paroxysmal atrial tachycardia: Patient without prior history of arrhythmias.  Admitted with back pain and during admission, has been found to have frequent PACs and brief runs of atrial tachycardia with rates into the 180s.  He denies any symptoms associated  with these events.  I note that he was previously discharged home on low-dose carvedilol however, he was not taking it.  Carvedilol 12.5 mg has now been ordered, as he has also been hypertensive.  TSH pending.  There is no role for anticoagulation as there was no evidence of atrial fibrillation.  He previously had an echocardiogram when he was here in May however, that has yet to be read by the assigned provider.  Stress test in May showed no evidence of ischemia and EF greater than 65% however.  Atrial arrhythmias likely driven by pulmonary hypertension which was suggested on CTA of the chest last admission.  He will require an outpatient sleep study.  2.  Pulmonary hypertension/acute on chronic heart failure with preserved ejection fraction: Patient with evidence of volume overload on examination including bibasilar crackles, increase in abdominal girth, and lower extremity edema.  We will follow-up echocardiogram that was previously performed.  I have added intravenous Lasix.  As above, he will need outpatient sleep study.  Can consider the addition of spironolactone pending response to diuretics.  3.  Essential hypertension: Stable.  4.  Type 2 diabetes mellitus: Per internal medicine.  5.  Pancytopenia: Undergoing evaluation for myeloproliferative disorder with bone marrow biopsy pending.  6.  History of chest pain/coronary calcium: Ruled out in May with nonischemic stress test.  Coronary calcium noted on CT.  Advised to start statin however, he says he and his primary care provider agreed to hold off.  Encouraged him to reconsider given ongoing diabetes and known coronary calcium.  Signed, Murray Hodgkins, NP 08/17/2020, 6:15 PM  For questions or updates, please contact   Please consult [email or web address] for contact info under Cardiology/STEMI.

## 2020-08-17 NOTE — Progress Notes (Signed)
Spoke with Special Procedures & the bone marrow procedure will be put on hold. Patient to be transferred.

## 2020-08-18 ENCOUNTER — Inpatient Hospital Stay: Payer: Commercial Managed Care - PPO

## 2020-08-18 DIAGNOSIS — R600 Localized edema: Secondary | ICD-10-CM | POA: Diagnosis not present

## 2020-08-18 DIAGNOSIS — I2510 Atherosclerotic heart disease of native coronary artery without angina pectoris: Secondary | ICD-10-CM

## 2020-08-18 DIAGNOSIS — I471 Supraventricular tachycardia: Secondary | ICD-10-CM | POA: Diagnosis not present

## 2020-08-18 DIAGNOSIS — G9341 Metabolic encephalopathy: Secondary | ICD-10-CM

## 2020-08-18 DIAGNOSIS — D61818 Other pancytopenia: Secondary | ICD-10-CM | POA: Diagnosis not present

## 2020-08-18 LAB — ECHOCARDIOGRAM COMPLETE
Height: 71 in
S' Lateral: 2.62 cm
Weight: 4384 oz

## 2020-08-18 LAB — CBC WITH DIFFERENTIAL/PLATELET
Abs Immature Granulocytes: 0.04 10*3/uL (ref 0.00–0.07)
Basophils Absolute: 0 10*3/uL (ref 0.0–0.1)
Basophils Relative: 1 %
Eosinophils Absolute: 0 10*3/uL (ref 0.0–0.5)
Eosinophils Relative: 0 %
HCT: 28.9 % — ABNORMAL LOW (ref 39.0–52.0)
Hemoglobin: 10 g/dL — ABNORMAL LOW (ref 13.0–17.0)
Immature Granulocytes: 2 %
Lymphocytes Relative: 28 %
Lymphs Abs: 0.6 10*3/uL — ABNORMAL LOW (ref 0.7–4.0)
MCH: 31 pg (ref 26.0–34.0)
MCHC: 34.6 g/dL (ref 30.0–36.0)
MCV: 89.5 fL (ref 80.0–100.0)
Monocytes Absolute: 0.2 10*3/uL (ref 0.1–1.0)
Monocytes Relative: 9 %
Neutro Abs: 1.3 10*3/uL — ABNORMAL LOW (ref 1.7–7.7)
Neutrophils Relative %: 60 %
Platelets: 33 10*3/uL — ABNORMAL LOW (ref 150–400)
RBC: 3.23 MIL/uL — ABNORMAL LOW (ref 4.22–5.81)
RDW: 14.5 % (ref 11.5–15.5)
WBC: 2.2 10*3/uL — ABNORMAL LOW (ref 4.0–10.5)
nRBC: 3.6 % — ABNORMAL HIGH (ref 0.0–0.2)

## 2020-08-18 LAB — AMMONIA: Ammonia: 36 umol/L — ABNORMAL HIGH (ref 9–35)

## 2020-08-18 LAB — GLUCOSE, CAPILLARY
Glucose-Capillary: 160 mg/dL — ABNORMAL HIGH (ref 70–99)
Glucose-Capillary: 135 mg/dL — ABNORMAL HIGH (ref 70–99)
Glucose-Capillary: 133 mg/dL — ABNORMAL HIGH (ref 70–99)
Glucose-Capillary: 138 mg/dL — ABNORMAL HIGH (ref 70–99)

## 2020-08-18 LAB — BASIC METABOLIC PANEL
Anion gap: 13 (ref 5–15)
BUN: 17 mg/dL (ref 8–23)
CO2: 29 mmol/L (ref 22–32)
Calcium: 8.4 mg/dL — ABNORMAL LOW (ref 8.9–10.3)
Chloride: 91 mmol/L — ABNORMAL LOW (ref 98–111)
Creatinine, Ser: 0.56 mg/dL — ABNORMAL LOW (ref 0.61–1.24)
GFR, Estimated: >60 mL/min (ref >60)
Glucose, Bld: 144 mg/dL — ABNORMAL HIGH (ref 70–99)
Potassium: 4.3 mmol/L (ref 3.5–5.1)
Sodium: 133 mmol/L — ABNORMAL LOW (ref 135–145)

## 2020-08-18 LAB — MAGNESIUM: Magnesium: 2 mg/dL (ref 1.7–2.4)

## 2020-08-18 LAB — TSH: TSH: 2.498 u[IU]/mL (ref 0.350–4.500)

## 2020-08-18 MED ORDER — HEPARIN SOD (PORK) LOCK FLUSH 100 UNIT/ML IV SOLN
INTRAVENOUS | Status: AC
  Filled 2020-08-18: qty 5

## 2020-08-18 MED ORDER — FENTANYL CITRATE (PF) 100 MCG/2ML IJ SOLN
INTRAMUSCULAR | Status: AC | PRN
  Administered 2020-08-18: 25 ug via INTRAVENOUS

## 2020-08-18 MED ORDER — OXYCODONE-ACETAMINOPHEN 5-325 MG PO TABS
1.0000 | ORAL_TABLET | ORAL | Status: DC | PRN
  Administered 2020-08-20: 1 via ORAL
  Filled 2020-08-18: qty 1

## 2020-08-18 MED ORDER — LACTULOSE 10 GM/15ML PO SOLN
20.0000 g | Freq: Two times a day (BID) | ORAL | Status: DC
  Administered 2020-08-18 – 2020-08-20 (×3): 20 g via ORAL
  Filled 2020-08-18 (×3): qty 30

## 2020-08-18 MED ORDER — HALOPERIDOL LACTATE 5 MG/ML IJ SOLN
5.0000 mg | Freq: Once | INTRAMUSCULAR | Status: AC
  Administered 2020-08-18: 5 mg via INTRAMUSCULAR
  Filled 2020-08-18: qty 1

## 2020-08-18 MED ORDER — HALOPERIDOL LACTATE 5 MG/ML IJ SOLN: 5.0000 mg | Freq: Once | INTRAMUSCULAR | Status: DC

## 2020-08-18 MED ORDER — MIDAZOLAM HCL 5 MG/5ML IJ SOLN
INTRAMUSCULAR | Status: AC
  Filled 2020-08-18: qty 5

## 2020-08-18 MED ORDER — SODIUM CHLORIDE FLUSH 0.9 % IV SOLN
INTRAVENOUS | Status: AC
  Filled 2020-08-18: qty 10

## 2020-08-18 MED ORDER — FENTANYL CITRATE (PF) 100 MCG/2ML IJ SOLN
INTRAMUSCULAR | Status: AC
  Filled 2020-08-18: qty 2

## 2020-08-18 MED ORDER — SODIUM CHLORIDE 0.9 % IV SOLN
INTRAVENOUS | Status: AC | PRN
  Administered 2020-08-18: 10 mL/h via INTRAVENOUS

## 2020-08-18 MED ORDER — SPIRONOLACTONE 25 MG PO TABS
25.0000 mg | ORAL_TABLET | Freq: Every day | ORAL | Status: DC
  Administered 2020-08-18 – 2020-08-19 (×2): 25 mg via ORAL
  Filled 2020-08-18 (×2): qty 1

## 2020-08-18 NOTE — Progress Notes (Signed)
   08/18/20 1448  Mobility  Activity Ambulated in room  Level of Assistance Moderate assist, patient does 50-74%  Assistive Device Crutches  Distance Ambulated (ft) 25 ft  Mobility Out of bed for toileting;Ambulated with assistance in room  Mobility Response Tolerated well  Mobility performed by Mobility specialist  $Mobility charge 1 Mobility    Mobility Specialist - Progress Note   Pre-mobility: HR 85, SpO2 93% During mobility: HR 101, SpO2 92% Post-mobility: HR 87, SPO2 92%  Pt was a little lethargic upon arrival. Currently on 2L while in bed, 3L when ambulating. Utilized crutches to ambulate to rest room and back. Had some difficulty getting out of bed, and laying back down. Needed to elevate bed to get patient to stand. Pt noted some pain in his crotch prior to using the restroom, but did not note the same pain afterwards. No other complaints noted.   Kathee Delton Mobility Specialist 08/18/20, 2:52 PM

## 2020-08-18 NOTE — Progress Notes (Addendum)
PROGRESS NOTE    Gregg Williams  WOE:[id number] DOB: 09-14-1955 DOA: 08/14/2020 PCP: Leonard Downing, MD   No chief complaint on file.   Brief Narrative:  Gregg Williams is an 65 y.o. male with medical history significant of HTN, HLD, pulmonary hypertension on 1.5 L oxygen at home, CAD, obesity, thrombocytopenia, chronic back pain, who presents with lower back pain and abnormal lab. Concern for myeloproliferative disease and BMB pending.  Stay complicated by atrial tachycardia and worsening dyspnea.     Assessment & Plan:   Principal Problem:   Lower back pain Active Problems:   Diabetes mellitus without complication (HCC)   Thrombocytopenia (HCC)   Pulmonary hypertension (HCC)   Chronic respiratory failure with hypoxia (HCC)   Hypokalemia   CAD (coronary artery disease)   SIRS (systemic inflammatory response syndrome) (HCC)   Obesity, Class III, BMI 40-49.9 (morbid obesity) ([place])   Pancytopenia (HCC)   #1 lower back pain -Patient noted to have chronic low back pain for greater than 6 years status post spinal surgery. -Patient noted to have had progressive worsening back pain for the past several weeks with radiation of back pain to the right leg laterally with also some complaints of mild right leg weakness. -No bladder or bowel incontinence. -It is noted that patient had some elevated markers at PCPs office with a CRP of 30, sed rate of 130, alk phosphatase of 140s. -MRI L-spine negative for spinal cord compression, showed concerns for myeloproliferative disorder, also noted was right sided disc protrusion L5-S1 with mild progression since 2020, flattening and displacement of right S1 nerve root.  Multilevel degenerative change throughout the lumbar spine otherwise similar to prior MRI. -Patient currently denies any significant pain. -Continue current pain regimen may need to cut back on dose as patient with some concerns for confusion this morning. -Continue Lidoderm  patch. -Bone marrow biopsy done this morning and pending. -We will need outpatient follow-up with primary neurosurgery.  2.  Pancytopenia -MRI L-spine with diffuse abnormal bone marrow developed since 2020 with concerns for myeloproliferative disorder. -Patient with no overt bleeding. -Hematology consulted and patient for bone marrow biopsy which was done this morning with results pending. -Per hematology/oncology.  3.  Acute metabolic encephalopathy -Per RN patient with some concerns for confusion this morning. -Patient alert to self only. -Ammonia levels slightly elevated at 36. -Abdominal ultrasound with findings concerning for possible cirrhosis. -Patient also noted to be on narcotic pain medication for problem #1. -Change Percocet to 1 tablet every 6 hours as needed. -Place on lactulose twice daily x2 days. -Patient with no focal neurological deficits. -Supportive care.  Follow.  4.  Paroxysmal atrial tachycardia -Patient seen in consultation by cardiology.  Patient noted to have some PACs. -Heart rate improved on current regimen of Coreg. -Per cardiology.  5.  Acute on chronic diastolic CHF exacerbation/pulmonary hypertension -Patient assessed by cardiology and is noted that patient has some crackles on examination with lower extremity edema on 08/17/2020. -Patient currently on Lasix 40 mg IV every 12 hours. -Patient with a urine output of 1.1 L noted through today. -Spironolactone added to regimen per cardiology. -Continue Coreg. -Per cardiology.  6.  Possible cirrhosis per abdominal ultrasound -Abdominal ultrasound with hepatomegaly and concerns for possible cirrhosis. -Patient denies any history of cirrhosis. -Currently on IV Lasix and Aldactone added today per cardiology. -Lactulose twice daily x2 days ordered due to concerns for acute metabolic encephalopathy. -Outpatient follow-up with GI.  7.  Lower extremity edema -Questionable etiology  differential included  possible cirrhosis, versus CHF, versus morbid obesity. -Improving with diuresis. -Patient with urine output of 1.1 L through today. -Continue current dose of IV Lasix, Aldactone added to regimen per cardiology. -Follow.  8.  Diabetes mellitus type 2 without complication -Hemoglobin A1c 7.1. -Patient noted to be on Jardiance and Amaryl. -CBG 160 this morning. -SSI.  9.  Hypokalemia -Repleted.  10.  Hyperlipidemia -Patient not on a statin. -Outpatient follow-up with PCP.  11.  Coronary artery disease -Stable. -Continue aspirin, Coreg, Lasix,  -Aldactone added today per cardiology.  12.  Morbid obesity  13.  Chronic respiratory failure -On 1.5 to 2 L nasal cannula chronically. -Outpatient follow-up.   DVT prophylaxis: SCDs Code Status: Full Family Communication: Updated sister, nephew at bedside. Disposition:   Status is: Inpatient  Remains inpatient appropriate because:Altered mental status  Dispo: The patient is from: Home              Anticipated d/c is to: Home              Patient currently is not medically stable to d/c.   Difficult to place patient No       Consultants:  Cardiology: Dr. Saunders Revel 08/17/2020 Interventional radiology: Dr. Anselm Pancoast 08/16/2020 Hematology/oncology: Dr. Rogue Bussing 08/16/2020  Procedures:  Chest x-ray 08/14/2020 MRI L-spine 08/14/2020 Abdominal ultrasound 08/15/2020 CT bone marrow biopsy and aspiration 08/18/2020 per Dr. Earleen Newport, IR     Antimicrobials:  IV cefepime 08/14/2020>>>> 08/16/2020 IV vancomycin 08/14/2020>>>>> 08/16/2020   Subjective: Sister from a very insistent on patient being transferred to [facility].  Per sister they have better doctors, better equipment, better specialist to take better care of him there. Patient just returning from bone marrow biopsy, denies any chest pain, no shortness of breath, no back or lower extremity pain today.  Family at bedside. Per RN patient with some confusion this morning.  Objective: Vitals:    08/18/20 1049 08/18/20 1054 08/18/20 1055 08/18/20 1103  BP: (!) 141/85 (!) 133/92 118/86 137/77  Pulse: 97 86 86 94  Resp: (!) 22 (!) $Remo'22 20 18  'CYJOb$ Temp:      TempSrc:      SpO2: (!) 39% 98% 99% 98%  Weight:      Height:        Intake/Output Summary (Last 24 hours) at 08/18/2020 1113 Last data filed at 08/18/2020 1035 Gross per 24 hour  Intake 240 ml  Output 1100 ml  Net -860 ml   Filed Weights   08/14/20 1008  Weight: 123.4 kg    Examination:  General exam: Appears calm and comfortable  Respiratory system: Clear to auscultation. Respiratory effort normal. Cardiovascular system: S1 & S2 heard, RRR. No JVD, murmurs, rubs, gallops or clicks.  2+ bilateral lower extremity edema. Gastrointestinal system: Abdomen is distended, soft, nontender, positive bowel sounds.  No rebound.  No guarding. Central nervous system: Alert and oriented x1. No focal neurological deficits. Extremities: Symmetric 5 x 5 power. Skin: No rashes, lesions or ulcers Psychiatry: Judgement and insight appear poor to fair. Mood & affect appropriate.     Data Reviewed: I have personally reviewed following labs and imaging studies  CBC: Recent Labs  Lab 08/14/20 1023 08/15/20 0813 08/16/20 0937 08/17/20 0442 08/18/20 0454  WBC 2.3* 2.0* 2.1* 2.3* 2.2*  NEUTROABS 1.2*  --   --  1.2* 1.3*  HGB 11.5* 11.0* 11.2* 10.9* 10.0*  HCT 34.2* 32.4* 32.4* 31.7* 28.9*  MCV 90.7 91.3 88.8 89.8 89.5  PLT 41* 39* 38* 40*  33*    Basic Metabolic Panel: Recent Labs  Lab 08/14/20 1023 08/14/20 1211 08/15/20 0813 08/16/20 0657 08/16/20 0937 08/17/20 0442 08/18/20 0813  NA 137  --  136  --  135 134*  --   K 3.4*  --  3.9  --  3.5 3.7  --   CL 93*  --  100  --  97* 95*  --   CO2 31  --  26  --  27 29  --   GLUCOSE 129*  --  133*  --  198* 166*  --   BUN 14  --  12  --  12 11  --   CREATININE 0.60*  --  0.48* 0.46* 0.55* 0.41*  --   CALCIUM 8.4*  --  8.2*  --  8.3* 8.4*  --   MG  --  2.1  --   --   --   --   2.0    GFR: Estimated Creatinine Clearance: 122.4 mL/min (A) (by C-G formula based on SCr of 0.41 mg/dL (L)).  Liver Function Tests: Recent Labs  Lab 08/14/20 1023  AST 23  ALT 13  ALKPHOS 89  BILITOT 1.0  PROT 6.8  ALBUMIN 2.4*    CBG: Recent Labs  Lab 08/17/20 0922 08/17/20 1147 08/17/20 1635 08/17/20 2040 08/18/20 0735  GLUCAP 202* 180* 184* 159* 160*     Recent Results (from the past 240 hour(s))  Culture, blood (routine x 2)     Status: None (Preliminary result)   Collection Time: 08/14/20  2:19 PM   Specimen: BLOOD  Result Value Ref Range Status   Specimen Description BLOOD RIGHT ANTECUBITAL  Final   Special Requests   Final    BOTTLES DRAWN AEROBIC AND ANAEROBIC Blood Culture adequate volume   Culture   Final    NO GROWTH 4 DAYS Performed at [facility], [address], [place], [place] [address]    Report Status PENDING  Incomplete  Culture, blood (routine x 2)     Status: None (Preliminary result)   Collection Time: 08/14/20  2:25 PM   Specimen: BLOOD  Result Value Ref Range Status   Specimen Description BLOOD RIGHT HAND  Final   Special Requests   Final    BOTTLES DRAWN AEROBIC AND ANAEROBIC Blood Culture adequate volume   Culture   Final    NO GROWTH 4 DAYS Performed at [facility], [address] [place], [place] [address]    Report Status PENDING  Incomplete  SARS CORONAVIRUS 2 (TAT 6-24 HRS) Nasopharyngeal Nasopharyngeal Swab     Status: None   Collection Time: 08/14/20  3:46 PM   Specimen: Nasopharyngeal Swab  Result Value Ref Range Status   SARS Coronavirus 2 NEGATIVE NEGATIVE Final    Comment: (NOTE) SARS-CoV-2 target nucleic acids are NOT DETECTED.  The SARS-CoV-2 RNA is generally detectable in upper and lower respiratory specimens during the acute phase of infection. Negative results do not preclude SARS-CoV-2 infection, do not rule out co-infections with other pathogens, and should not be used as the sole  basis for treatment or other patient management decisions. Negative results must be combined with clinical observations, patient history, and epidemiological information. The expected result is Negative.  Fact Sheet for Patients: SugarRoll.be  Fact Sheet for Healthcare Providers: https://www.woods-mathews.com/  This test is not yet approved or cleared by the [place] FDA and  has been authorized for detection and/or diagnosis of SARS-CoV-2 by FDA under an Emergency  Use Authorization (EUA). This EUA will remain  in effect (meaning this test can be used) for the duration of the COVID-19 declaration under Se ction 564(b)(1) of the Act, 21 U.S.C. section 360bbb-3(b)(1), unless the authorization is terminated or revoked sooner.  Performed at [facility], [place] [address], [place], [place] [address]          Radiology Studies: No results found.      Scheduled Meds:  heparin lock flush       aspirin EC  81 mg Oral Daily   carvedilol  12.5 mg Oral BID WC   cyanocobalamin  1,000 mcg Intramuscular Daily   docusate sodium  100 mg Oral Daily   fentaNYL       furosemide  40 mg Intravenous BID   insulin aspart  0-5 Units Subcutaneous QHS   insulin aspart  0-9 Units Subcutaneous TID WC   lidocaine  1 patch Transdermal Q24H   midazolam       mometasone-formoterol  2 puff Inhalation BID   polyethylene glycol  17 g Oral BID   sodium chloride flush       Continuous Infusions:   LOS: 3 days    Time spent: 40 minutes    Irine Seal, MD [facility]   To contact the attending provider between 7A-7P or the covering provider during after hours 7P-7A, please log into the web site [email or web address] and access using universal [place] password for that web site. If you do not have the password, please call the hospital operator.  08/18/2020, 11:13 AM

## 2020-08-18 NOTE — Progress Notes (Signed)
PIV consult: Notified Jamas Lav, RN that there is no IV Team coverage overnight on [place]. Recommended using charge, SWOT and [facility] nurse resources for peripheral site. IV Team will be available after 0700.

## 2020-08-18 NOTE — Progress Notes (Signed)
Progress Note  Patient Name: Gregg Williams Date of Encounter: 08/18/2020  [facility] Cardiologist: Nelva Bush, MD   Subjective   Patient will have bone marrow biopsy performed.  Denies palpitations or chest pain, shortness of breath is improved.  Edema is also improved.  States he is getting discharged today.  Inpatient Medications    Scheduled Meds:  aspirin EC  81 mg Oral Daily   carvedilol  12.5 mg Oral BID WC   cyanocobalamin  1,000 mcg Intramuscular Daily   docusate sodium  100 mg Oral Daily   fentaNYL       furosemide  40 mg Intravenous BID   heparin lock flush       insulin aspart  0-5 Units Subcutaneous QHS   insulin aspart  0-9 Units Subcutaneous TID WC   lidocaine  1 patch Transdermal Q24H   mometasone-formoterol  2 puff Inhalation BID   polyethylene glycol  17 g Oral BID   sodium chloride flush       Continuous Infusions:  PRN Meds: acetaminophen, albuterol, alum & mag hydroxide-simeth, dextromethorphan-guaiFENesin, hydrALAZINE, methocarbamol, metoprolol tartrate, naphazoline-glycerin, ondansetron (ZOFRAN) IV, oxyCODONE-acetaminophen, traZODone   Vital Signs    Vitals:   08/18/20 1055 08/18/20 1103 08/18/20 1131 08/18/20 1202  BP: 118/86 137/77 125/62 (!) 118/55  Pulse: 86 94 86 85  Resp: 20 18 (!) 22 (!) 22  Temp:   98.6 F (37 C) 98.2 F (36.8 C)  TempSrc:   Oral Oral  SpO2: 99% 98% 94% 95%  Weight:      Height:        Intake/Output Summary (Last 24 hours) at 08/18/2020 1238 Last data filed at 08/18/2020 1035 Gross per 24 hour  Intake 240 ml  Output 1100 ml  Net -860 ml   Last 3 Weights 08/14/2020 07/16/2020  Weight (lbs) 272 lb 274 lb  Weight (kg) 123.378 kg 124.286 kg      Telemetry    Sinus rhythm, PACs, occasional atrial tachycardia- Personally Reviewed  ECG    No new tracing noted- Personally Reviewed  Physical Exam   GEN: No acute distress.   Neck: No JVD Cardiac: RRR, distant heart sounds, no murmurs Respiratory:  Decreased breath sounds at bases GI: Soft, nontender, significant abdominal distention MS: 1+ edema; No deformity. Neuro:  Nonfocal  Psych: Normal affect   Labs    High Sensitivity Troponin:   Recent Labs  Lab 08/14/20 1023 08/14/20 1211  TROPONINIHS 7 7      Chemistry Recent Labs  Lab 08/14/20 1023 08/15/20 0813 08/16/20 0937 08/17/20 0442 08/18/20 0454  NA 137   < > 135 134* 133*  K 3.4*   < > 3.5 3.7 4.3  CL 93*   < > 97* 95* 91*  CO2 31   < > $R'27 29 29  'je$ GLUCOSE 129*   < > 198* 166* 144*  BUN 14   < > $R'12 11 17  'cc$ CREATININE 0.60*   < > 0.55* 0.41* 0.56*  CALCIUM 8.4*   < > 8.3* 8.4* 8.4*  PROT 6.8  --   --   --   --   ALBUMIN 2.4*  --   --   --   --   AST 23  --   --   --   --   ALT 13  --   --   --   --   ALKPHOS 89  --   --   --   --  BILITOT 1.0  --   --   --   --   GFRNONAA >60   < > >60 >60 >60  ANIONGAP 13   < > $R'11 10 13   'yG$ < > = values in this interval not displayed.     Hematology Recent Labs  Lab 08/16/20 0937 08/17/20 0442 08/18/20 0454  WBC 2.1* 2.3* 2.2*  RBC 3.65* 3.53* 3.23*  HGB 11.2* 10.9* 10.0*  HCT 32.4* 31.7* 28.9*  MCV 88.8 89.8 89.5  MCH 30.7 30.9 31.0  MCHC 34.6 34.4 34.6  RDW 14.4 14.3 14.5  PLT 38* 40* 33*    BNP Recent Labs  Lab 08/14/20 1023  BNP 217.5*     DDimer No results for input(s): DDIMER in the last 168 hours.   Radiology    CT BONE MARROW BIOPSY & ASPIRATION  Result Date: 08/18/2020 INDICATION: 65 year old male with possible myelodysplastic syndrome referred for biopsy EXAM: CT BONE MARROW BIOPSY AND ASPIRATION MEDICATIONS: None. ANESTHESIA/SEDATION: Moderate (conscious) sedation was employed during this procedure. A total of Versed 0 mg and Fentanyl 25 mcg was administered intravenously. Moderate Sedation Time: None minutes. The patient's level of consciousness and vital signs were monitored continuously by radiology nursing throughout the procedure under my direct supervision. FLUOROSCOPY TIME:  CT  COMPLICATIONS: None PROCEDURE: Informed written consent was obtained from the patient after a thorough discussion of the procedural risks, benefits and alternatives. All questions were addressed. Maximal Sterile Barrier Technique was utilized including caps, mask, sterile gowns, sterile gloves, sterile drape, hand hygiene and skin antiseptic. A timeout was performed prior to the initiation of the procedure. The procedure risks, benefits, and alternatives were explained to the patient. Questions regarding the procedure were encouraged and answered. The patient understands and consents to the procedure. Scout CT of the pelvis was performed for surgical planning purposes. The right posterior pelvis was prepped with Chlorhexidine in a sterile fashion, and a sterile drape was applied covering the operative field. A sterile gown and sterile gloves were used for the procedure. Local anesthesia was provided with 1% Lidocaine. Posterior iliac bone was targeted for biopsy. The skin and subcutaneous tissues were infiltrated with 1% lidocaine without epinephrine. A small stab incision was made with an 11 blade scalpel, and an 11 gauge Murphy needle was advanced with CT guidance to the posterior cortex. Manual forced was used to advance the needle through the posterior cortex and the stylet was removed. A bone marrow aspirate was retrieved and passed to a cytotechnologist in the room. The Murphy needle was then advanced without the stylet for a core biopsy. The core biopsy was retrieved and also passed to a cytotechnologist. Manual pressure was used for hemostasis and a sterile dressing was placed. No complications were encountered no significant blood loss was encountered. Patient tolerated the procedure well and remained hemodynamically stable throughout. IMPRESSION: Status post CT-guided bone marrow biopsy, with tissue specimen sent to pathology for complete histopathologic analysis Signed, Dulcy Fanny. Earleen Newport, DO [facility] Radiology Electronically Signed   By: Corrie Mckusick D.O.   On: 08/18/2020 11:46    Cardiac Studies   Echo 07/17/2021  1. Left ventricular ejection fraction, by estimation, is >55%. The left  ventricle has normal function. Left ventricular endocardial border not  optimally defined to evaluate regional wall motion. There is mild left  ventricular hypertrophy. Left  ventricular diastolic function could not be evaluated.   2. Right ventricular systolic function is mildly reduced. The right  ventricular size is normal. Mildly increased right ventricular wall  thickness. Tricuspid regurgitation signal is inadequate for assessing PA  pressure.   3. The mitral valve is grossly normal. No evidence of mitral valve  regurgitation.   4. The aortic valve was not well visualized. Aortic valve regurgitation  not well assessed.   5. Aortic dilatation noted. There is borderline dilatation of the aortic  root, measuring 39 mm.   6. The inferior vena cava is normal in size with greater than 50%  respiratory variability, suggesting right atrial pressure of 3 mmHg.   Patient Profile     65 y.o. male with history of obesity, hypertension, respiratory failure presenting with chronic back pain, diagnosed with atrial tachycardia and pancytopenia.  Being seen due to atrial tachycardia, lower extremity edema and possible HFpEF  Assessment & Plan    Atrial tachycardia -No evidence for atrial fibrillation -Heart rates improved with Coreg.  Continue Coreg  2.  Lower extremity edema,  -Etiology/differential includes -morbid obesity, possible cirrhosis -Edema is improving, net -1.1 L over the past 24 hours -Continue Lasix 40 mg twice daily IV.  Upon discharge, consider po torsemide due to better activity in light of abdominal obesity.  3.  Pancytopenia -Underwent bone marrow biopsy today -Management as per hematology  4.  Possible cirrhosis as per abdominal  ultrasound -Management as per medicine team  Total encounter time 35 minutes  Greater than 50% was spent in counseling and coordination of care with the patient       Signed, Kate Sable, MD  08/18/2020, 12:38 PM

## 2020-08-18 NOTE — Progress Notes (Signed)
Progress Note  Patient Name: Gregg Williams Date of Encounter: 08/18/2020  Primary Cardiologist: Nelva Bush, MD  Subjective   S/p bone marrow bx this AM.  Sitting up and bed and eating.  Denies chest pain, dyspnea, palpitations.  Has cont to have freq PACs w/ freq, though brief runs of atrial tachycardia on tele.  RN notes that he had confusion overnight - pulled out IV, pulled off tele.  Currently stable/oriented.  Inpatient Medications    Scheduled Meds:  aspirin EC  81 mg Oral Daily   carvedilol  12.5 mg Oral BID WC   cyanocobalamin  1,000 mcg Intramuscular Daily   docusate sodium  100 mg Oral Daily   fentaNYL       furosemide  40 mg Intravenous BID   heparin lock flush       insulin aspart  0-5 Units Subcutaneous QHS   insulin aspart  0-9 Units Subcutaneous TID WC   lidocaine  1 patch Transdermal Q24H   mometasone-formoterol  2 puff Inhalation BID   polyethylene glycol  17 g Oral BID   sodium chloride flush       Continuous Infusions:  PRN Meds: acetaminophen, albuterol, alum & mag hydroxide-simeth, dextromethorphan-guaiFENesin, hydrALAZINE, methocarbamol, metoprolol tartrate, naphazoline-glycerin, ondansetron (ZOFRAN) IV, oxyCODONE-acetaminophen, traZODone   Vital Signs    Vitals:   08/18/20 1055 08/18/20 1103 08/18/20 1131 08/18/20 1202  BP: 118/86 137/77 125/62 (!) 118/55  Pulse: 86 94 86 85  Resp: 20 18 (!) 22 (!) 22  Temp:   98.6 F (37 C) 98.2 F (36.8 C)  TempSrc:   Oral Oral  SpO2: 99% 98% 94% 95%  Weight:      Height:        Intake/Output Summary (Last 24 hours) at 08/18/2020 1224 Last data filed at 08/18/2020 1035 Gross per 24 hour  Intake 240 ml  Output 1100 ml  Net -860 ml   Filed Weights   08/14/20 1008  Weight: 123.4 kg    Physical Exam   GEN: Obese, in no acute distress.  HEENT: Grossly normal.  Neck: Supple, obese, difficult to gauge JVP.  No carotid bruits, or masses. Cardiac: RRR, no murmurs, rubs, or gallops. No clubbing,  cyanosis.  2+ bilat LE edema to mid calves.  Radials 2+, DP/PT 1+ and equal bilaterally.  Respiratory:  Respirations regular and unlabored, diminished breath sounds w/ bibasilar crackles. GI: Obese, sl softer than yesterday.  Nontender, BS + x 4. MS: no deformity or atrophy. Skin: warm and dry, no rash. Neuro:  Strength and sensation are intact. Psych: AAOx3.  Normal affect.  Labs    Chemistry Recent Labs  Lab 08/14/20 1023 08/15/20 0813 08/16/20 0937 08/17/20 0442 08/18/20 0454  NA 137   < > 135 134* 133*  K 3.4*   < > 3.5 3.7 4.3  CL 93*   < > 97* 95* 91*  CO2 31   < > _0 GLUCOSE 129*   < > 198* 166* 144*  BUN 14   < > _1 CREATININE 0.60*   < > 0.55* 0.41* 0.56*  CALCIUM 8.4*   < > 8.3* 8.4* 8.4*  PROT 6.8  --   --   --   --   ALBUMIN 2.4*  --   --   --   --   AST 23  --   --   --   --   ALT 13  --   --   --   --  ALKPHOS 89  --   --   --   --   BILITOT 1.0  --   --   --   --   GFRNONAA >60   < > >60 >60 >60  ANIONGAP 13   < > _0 < > = values in this interval not displayed.     Hematology Recent Labs  Lab 08/16/20 0937 08/17/20 0442 08/18/20 0454  WBC 2.1* 2.3* 2.2*  RBC 3.65* 3.53* 3.23*  HGB 11.2* 10.9* 10.0*  HCT 32.4* 31.7* 28.9*  MCV 88.8 89.8 89.5  MCH 30.7 30.9 31.0  MCHC 34.6 34.4 34.6  RDW 14.4 14.3 14.5  PLT 38* 40* 33*    Cardiac Enzymes  Recent Labs  Lab 08/14/20 1023 08/14/20 1211  TROPONINIHS 7 7      BNP Recent Labs  Lab 08/14/20 1023  BNP 217.5*      Lipids  Lab Results  Component Value Date   CHOL 127 07/17/2020   HDL 33 (L) 07/17/2020   LDLCALC 71 07/17/2020   TRIG 113 07/17/2020   CHOLHDL 3.8 07/17/2020    HbA1c  Lab Results  Component Value Date   HGBA1C 7.1 (H) 07/16/2020    Radiology    MR LUMBAR SPINE WO CONTRAST  Result Date: 08/14/2020 CLINICAL DATA:  Right low back pain and right hip pain. Anemia and thrombocytopenia. EXAM: MRI LUMBAR SPINE WITHOUT CONTRAST TECHNIQUE:  Multiplanar, multisequence MR imaging of the lumbar spine was performed. No intravenous contrast was administered. COMPARISON:  MRI lumbar spine 11/15/2018 FINDINGS: Segmentation:  Normal Alignment:  Normal Vertebrae:  Negative for fracture. Bone marrow is diffusely abnormal and very low signal on T1 and T2. This has developed since 2020. No focal lesion identified in the bone marrow. Conus medullaris and cauda equina: Conus extends to the L1-2 level. Conus and cauda equina appear normal. Paraspinal and other soft tissues: Negative for paraspinous mass or adenopathy Disc levels: L1-2: Mild degenerative change.  Negative for stenosis L2-3: Disc bulging and bilateral facet hypertrophy. Mild to moderate spinal stenosis. Mild subarticular stenosis bilaterally. No interval change. L3-4: Disc degeneration with diffuse disc bulging and endplate spurring. This is asymmetric to the right and similar to the prior study. Bilateral facet degeneration. Mild spinal stenosis. Moderate subarticular stenosis bilaterally unchanged. L4-5: Disc degeneration with disc bulging and diffuse endplate spurring. Small annular fissure on the right with small disc protrusion unchanged from the prior study. Bilateral facet degeneration. Mild subarticular stenosis bilaterally. L5-S1: Right-sided disc protrusion has progressed with right S1 nerve root impingement IMPRESSION: Diffusely abnormal bone marrow which has developed since 2020. Possible myeloproliferative disorder. No fracture Right-sided disc protrusion L5-S1 with mild progression since 2020. There is flattening and displacement of the right S1 nerve root. Multilevel degenerative change throughout the lumbar spine otherwise similar to the prior MRI. Electronically Signed   By: Franchot Gallo M.D.   On: 08/14/2020 17:39   US Abdomen Complete  Result Date: 08/15/2020 CLINICAL DATA:  Hepatomegaly EXAM: ABDOMEN ULTRASOUND COMPLETE COMPARISON:  CT 11/16/2011 FINDINGS: Gallbladder: Diffuse  gallbladder wall thickening measuring approximately 6.5 mm. No gallstones or pericholecystic fluid visualized. No sonographic Murphy sign noted by sonographer. Common bile duct: Diameter: 3 mm. Liver: Liver is enlarged measuring at least 22 cm in length. Nodular hepatic surface contour. No focal lesion identified. Within normal limits in parenchymal echogenicity. Portal vein is patent on color Doppler imaging with normal direction of blood flow towards the liver. IVC: No abnormality visualized. Pancreas:  Visualized portion unremarkable. Spleen: Appearance is within normal limits. Spleen measures 12.1 cm in length. Right Kidney: Length: 12.2 cm. Echogenicity within normal limits. No mass or hydronephrosis visualized. Left Kidney: Length: 12.6 cm. Echogenicity within normal limits. No mass or hydronephrosis visualized. Abdominal aorta: No aneurysm visualized. Other findings: Small volume perihepatic ascites. IMPRESSION: 1. Hepatomegaly. Nodular hepatic surface contour suggestive of cirrhosis. Correlate with liver function tests. 2. Small volume perihepatic ascites. 3. Spleen measures upper limits of normal in size. 4. Diffuse gallbladder wall thickening which may be secondary to intrinsic liver disease. No cholelithiasis and no sonographic Murphy sign to suggest cholecystitis. Electronically Signed   By: Davina Poke D.O.   On: 08/15/2020 17:20   CT BONE MARROW BIOPSY & ASPIRATION  Result Date: 08/18/2020 INDICATION: 65 year old male with possible myelodysplastic syndrome referred for biopsy EXAM: CT BONE MARROW BIOPSY AND ASPIRATION MEDICATIONS: None. ANESTHESIA/SEDATION: Moderate (conscious) sedation was employed during this procedure. A total of Versed 0 mg and Fentanyl 25 mcg was administered intravenously. Moderate Sedation Time: None minutes. The patient's level of consciousness and vital signs were monitored continuously by radiology nursing throughout the procedure under my direct supervision.  FLUOROSCOPY TIME:  CT COMPLICATIONS: None PROCEDURE: Informed written consent was obtained from the patient after a thorough discussion of the procedural risks, benefits and alternatives. All questions were addressed. Maximal Sterile Barrier Technique was utilized including caps, mask, sterile gowns, sterile gloves, sterile drape, hand hygiene and skin antiseptic. A timeout was performed prior to the initiation of the procedure. The procedure risks, benefits, and alternatives were explained to the patient. Questions regarding the procedure were encouraged and answered. The patient understands and consents to the procedure. Scout CT of the pelvis was performed for surgical planning purposes. The right posterior pelvis was prepped with Chlorhexidine in a sterile fashion, and a sterile drape was applied covering the operative field. A sterile gown and sterile gloves were used for the procedure. Local anesthesia was provided with 1% Lidocaine. Posterior iliac bone was targeted for biopsy. The skin and subcutaneous tissues were infiltrated with 1% lidocaine without epinephrine. A small stab incision was made with an 11 blade scalpel, and an 11 gauge Murphy needle was advanced with CT guidance to the posterior cortex. Manual forced was used to advance the needle through the posterior cortex and the stylet was removed. A bone marrow aspirate was retrieved and passed to a cytotechnologist in the room. The Murphy needle was then advanced without the stylet for a core biopsy. The core biopsy was retrieved and also passed to a cytotechnologist. Manual pressure was used for hemostasis and a sterile dressing was placed. No complications were encountered no significant blood loss was encountered. Patient tolerated the procedure well and remained hemodynamically stable throughout. IMPRESSION: Status post CT-guided bone marrow biopsy, with tissue specimen sent to pathology for complete histopathologic analysis Signed, Dulcy Fanny.  Earleen Newport, DO [facility] Electronically Signed   By: Corrie Mckusick D.O.   On: 08/18/2020 11:46    Telemetry    RSR, frequent PACs, fewer and less prolonged runs of PAT - Personally Reviewed  Cardiac Studies   Lexiscan Cardiolite 5.2022 There was no ST segment deviation noted during stress. The study is normal with no evidence of ischemia. This is a low risk study. The left ventricular ejection fraction is hyperdynamic (>65%). _____________  2D Echocardiogram 5.2022   1. Left ventricular ejection fraction, by estimation, is >55%. The left  ventricle has normal function. Left ventricular  endocardial border not  optimally defined to evaluate regional wall motion. There is mild left  ventricular hypertrophy. Left  ventricular diastolic function could not be evaluated.   2. Right ventricular systolic function is mildly reduced. The right  ventricular size is normal. Mildly increased right ventricular wall  thickness. Tricuspid regurgitation signal is inadequate for assessing PA  pressure.   3. The mitral valve is grossly normal. No evidence of mitral valve  regurgitation.   4. The aortic valve was not well visualized. Aortic valve regurgitation  not well assessed.   5. Aortic dilatation noted. There is borderline dilatation of the aortic  root, measuring 39 mm.   6. The inferior vena cava is normal in size with greater than 50%  respiratory variability, suggesting right atrial pressure of 3 mmHg.  _____________   Patient Profile     65 y.o. male with a history of obesity, HTN, DMII, chronic resp failure w/ home O2 (PRN), pulmonary hypertension, and chronic back pain, who was admitted 6/18 w/ progressive back pain and is being worked up for myeloproliferative disorder in the setting of pancytopenia.  He has also been noted to have frequent PACs and runs of PAT.  Assessment & Plan    1.  Paroxysmal atrial tachycardia: Patient  w/o prior history of arrhythmias.  Admitted with back pain and during admission, has been found to have frequent PACs and runs of atrial tachycardia with rates into the 180s on June 21.  Carvedilol 12.5 mg twice daily was ordered with overall improvement in atrial arrhythmia burden.  He still has frequent PACs but less frequent runs of atrial tachycardia and no prolonged/sustained runs of atrial tachycardia.  He is not symptomatic.  Echocardiogram from last month showed an EF greater than 55% with mild reduction in RV function.  He will need outpatient sleep study in the setting of pulmonary hypertension.  2.  Pulmonary hypertension/acute on chronic heart failure with preserved ejection fraction: Noted to have crackles on examination with lower extremity edema on June 21.  Intravenous Lasix added.  I's and O's are inaccurate. Renal fxn stable this AM.  He denies dyspnea but cont to have bibasilar crackles and 2+ edema to mid calves.  Rec continuing IV lasix today.  I will add spironolactone.  Will likely need at least low dose oral torsemide @ discharge as well as outpt CHF f/u and sleep study.  In long run, may benefit from [facility] and referral to advanced CHF/PAH clinic in [place].  3.  Essential hypertension: Stable on carvedilol therapy.  4.  Type 2 diabetes mellitus: Per internal medicine.  5.  Myeloproliferative disorder/pancytopenia: Abnormal MRI of the lower back and ongoing pancytopenia.  Seen by hematology/oncology.  Status post bone marrow biopsy this morning with plan for outpatient follow-up.  6.  History of chest pain/coronary calcium: Ruled out in May with nonischemic stress test.  Coronary calcium noted on CT.  Advised to start statin however, he says he and his primary care provider agreed to hold off.  Encouraged him to reconsider given ongoing diabetes and known coronary calcium.  Signed, Murray Hodgkins, NP  08/18/2020, 12:24 PM    For questions or updates, please contact   Please  consult [email or web address] for contact info under Cardiology/STEMI.

## 2020-08-18 NOTE — Procedures (Signed)
Interventional Radiology Procedure Note  Procedure: CT guided aspirate and core biopsy of right posterior iliac bone Complications: None Recommendations: - Bedrest supine at least x 1 hrs - OTC's PRN  Pain - Follow biopsy results  Signed,  Dulcy Fanny. Earleen Newport, DO

## 2020-08-18 NOTE — Progress Notes (Signed)
Chaplain Maggie made initial visit with patient and met both patient's sister and his son. Chaplain offered non-anxious presence. Room was made for storytelling and empathetic listening. Chaplain prayed with patient's son in the hallway outside the room. Continued spiritual and social support available per on call chaplain.

## 2020-08-18 NOTE — Care Plan (Signed)
Family insistent this morning on patient being transferred to [facility]. Called and spoke with [facility] transfer center and spoke with a GEN med MD who reviewed the chart and discussed patient's care with me on the telephone. At this time unfortunately [facility] is at capacity.  Feel appropriate work-up for patient's presentation currently being done in few transferring the patient will be a setback and at this time transfer has been declined.  No charge.

## 2020-08-19 ENCOUNTER — Inpatient Hospital Stay: Payer: Commercial Managed Care - PPO

## 2020-08-19 ENCOUNTER — Other Ambulatory Visit: Payer: Self-pay

## 2020-08-19 ENCOUNTER — Ambulatory Visit: Payer: Commercial Managed Care - PPO | Admitting: Physician Assistant

## 2020-08-19 DIAGNOSIS — D471 Chronic myeloproliferative disease: Secondary | ICD-10-CM | POA: Diagnosis not present

## 2020-08-19 DIAGNOSIS — D61818 Other pancytopenia: Secondary | ICD-10-CM | POA: Diagnosis not present

## 2020-08-19 DIAGNOSIS — C9500 Acute leukemia of unspecified cell type not having achieved remission: Principal | ICD-10-CM

## 2020-08-19 DIAGNOSIS — E119 Type 2 diabetes mellitus without complications: Secondary | ICD-10-CM

## 2020-08-19 DIAGNOSIS — I480 Paroxysmal atrial fibrillation: Secondary | ICD-10-CM

## 2020-08-19 DIAGNOSIS — R6510 Systemic inflammatory response syndrome (SIRS) of non-infectious origin without acute organ dysfunction: Secondary | ICD-10-CM

## 2020-08-19 DIAGNOSIS — D696 Thrombocytopenia, unspecified: Secondary | ICD-10-CM

## 2020-08-19 DIAGNOSIS — R160 Hepatomegaly, not elsewhere classified: Secondary | ICD-10-CM

## 2020-08-19 DIAGNOSIS — G9341 Metabolic encephalopathy: Secondary | ICD-10-CM

## 2020-08-19 DIAGNOSIS — I4892 Unspecified atrial flutter: Secondary | ICD-10-CM

## 2020-08-19 DIAGNOSIS — I25118 Atherosclerotic heart disease of native coronary artery with other forms of angina pectoris: Secondary | ICD-10-CM

## 2020-08-19 DIAGNOSIS — J9611 Chronic respiratory failure with hypoxia: Secondary | ICD-10-CM

## 2020-08-19 LAB — CBC WITH DIFFERENTIAL/PLATELET
Abs Immature Granulocytes: 0.16 10*3/uL — ABNORMAL HIGH (ref 0.00–0.07)
Basophils Absolute: 0 10*3/uL (ref 0.0–0.1)
Basophils Relative: 1 %
Eosinophils Absolute: 0 10*3/uL (ref 0.0–0.5)
Eosinophils Relative: 0 %
HCT: 26.3 % — ABNORMAL LOW (ref 39.0–52.0)
Hemoglobin: 9.1 g/dL — ABNORMAL LOW (ref 13.0–17.0)
Immature Granulocytes: 10 %
Lymphocytes Relative: 40 %
Lymphs Abs: 0.7 10*3/uL (ref 0.7–4.0)
MCH: 31.1 pg (ref 26.0–34.0)
MCHC: 34.6 g/dL (ref 30.0–36.0)
MCV: 89.8 fL (ref 80.0–100.0)
Monocytes Absolute: 0.2 10*3/uL (ref 0.1–1.0)
Monocytes Relative: 9 %
Neutro Abs: 0.7 10*3/uL — ABNORMAL LOW (ref 1.7–7.7)
Neutrophils Relative %: 40 %
Platelets: 25 10*3/uL — CL (ref 150–400)
RBC: 2.93 MIL/uL — ABNORMAL LOW (ref 4.22–5.81)
RDW: 14.5 % (ref 11.5–15.5)
Smear Review: NORMAL
WBC: 1.7 10*3/uL — ABNORMAL LOW (ref 4.0–10.5)
nRBC: 3.6 % — ABNORMAL HIGH (ref 0.0–0.2)

## 2020-08-19 LAB — GLUCOSE, CAPILLARY
Glucose-Capillary: 144 mg/dL — ABNORMAL HIGH (ref 70–99)
Glucose-Capillary: 185 mg/dL — ABNORMAL HIGH (ref 70–99)
Glucose-Capillary: 138 mg/dL — ABNORMAL HIGH (ref 70–99)
Glucose-Capillary: 160 mg/dL — ABNORMAL HIGH (ref 70–99)
Glucose-Capillary: 164 mg/dL — ABNORMAL HIGH (ref 70–99)

## 2020-08-19 LAB — BASIC METABOLIC PANEL
Anion gap: 8 (ref 5–15)
Anion gap: 9 (ref 5–15)
BUN: 27 mg/dL — ABNORMAL HIGH (ref 8–23)
BUN: 23 mg/dL (ref 8–23)
CO2: 35 mmol/L — ABNORMAL HIGH (ref 22–32)
CO2: 37 mmol/L — ABNORMAL HIGH (ref 22–32)
Calcium: 8.4 mg/dL — ABNORMAL LOW (ref 8.9–10.3)
Calcium: 8.6 mg/dL — ABNORMAL LOW (ref 8.9–10.3)
Chloride: 90 mmol/L — ABNORMAL LOW (ref 98–111)
Chloride: 86 mmol/L — ABNORMAL LOW (ref 98–111)
Creatinine, Ser: 0.61 mg/dL (ref 0.61–1.24)
Creatinine, Ser: 0.67 mg/dL (ref 0.61–1.24)
GFR, Estimated: >60 mL/min (ref >60)
GFR, Estimated: >60 mL/min (ref >60)
Glucose, Bld: 146 mg/dL — ABNORMAL HIGH (ref 70–99)
Glucose, Bld: 147 mg/dL — ABNORMAL HIGH (ref 70–99)
Potassium: 4.5 mmol/L (ref 3.5–5.1)
Potassium: 3.8 mmol/L (ref 3.5–5.1)
Sodium: 133 mmol/L — ABNORMAL LOW (ref 135–145)
Sodium: 132 mmol/L — ABNORMAL LOW (ref 135–145)

## 2020-08-19 LAB — CULTURE, BLOOD (ROUTINE X 2)
Culture: NO GROWTH
Culture: NO GROWTH
Special Requests: ADEQUATE
Special Requests: ADEQUATE

## 2020-08-19 LAB — RESP PANEL BY RT-PCR (FLU A&B, COVID) ARPGX2
Influenza A by PCR: NEGATIVE
Influenza B by PCR: NEGATIVE
SARS Coronavirus 2 by RT PCR: NEGATIVE

## 2020-08-19 LAB — MAGNESIUM: Magnesium: 2 mg/dL (ref 1.7–2.4)

## 2020-08-19 LAB — MRSA NEXT GEN BY PCR, NASAL: MRSA by PCR Next Gen: NOT DETECTED

## 2020-08-19 LAB — AMMONIA: Ammonia: 47 umol/L — ABNORMAL HIGH (ref 9–35)

## 2020-08-19 LAB — PROTIME-INR
INR: 1.1 (ref 0.8–1.2)
Prothrombin Time: 14.4 seconds (ref 11.4–15.2)

## 2020-08-19 MED ORDER — CARVEDILOL 25 MG PO TABS: 25.0000 mg | ORAL_TABLET | Freq: Two times a day (BID) | ORAL | 1 refills | Status: DC

## 2020-08-19 MED ORDER — CHLORHEXIDINE GLUCONATE CLOTH 2 % EX PADS
6.0000 | MEDICATED_PAD | Freq: Every day | CUTANEOUS | Status: DC
  Administered 2020-08-19: 6 via TOPICAL

## 2020-08-19 MED ORDER — RIFAXIMIN 550 MG PO TABS: 550.0000 mg | ORAL_TABLET | Freq: Two times a day (BID) | ORAL | 1 refills | Status: AC

## 2020-08-19 MED ORDER — ACETAMINOPHEN 325 MG PO TABS: 650.0000 mg | ORAL_TABLET | Freq: Four times a day (QID) | ORAL | Status: DC | PRN

## 2020-08-19 MED ORDER — LACTULOSE 10 GM/15ML PO SOLN: 20.0000 g | Freq: Two times a day (BID) | ORAL | 0 refills | Status: AC

## 2020-08-19 MED ORDER — POTASSIUM CHLORIDE 20 MEQ PO PACK: 20.0000 meq | PACK | Freq: Once | ORAL | Status: DC

## 2020-08-19 MED ORDER — AMIODARONE HCL IN DEXTROSE 360-4.14 MG/200ML-% IV SOLN: 30.0000 mg/h | INTRAVENOUS | Status: AC

## 2020-08-19 MED ORDER — ADENOSINE 6 MG/2ML IV SOLN
6.0000 mg | Freq: Once | INTRAVENOUS | Status: AC
  Administered 2020-08-19: 6 mg via INTRAVENOUS
  Filled 2020-08-19: qty 2

## 2020-08-19 MED ORDER — RIFAXIMIN 550 MG PO TABS
550.0000 mg | ORAL_TABLET | Freq: Two times a day (BID) | ORAL | Status: DC
  Administered 2020-08-19 – 2020-08-20 (×2): 550 mg via ORAL
  Filled 2020-08-19 (×4): qty 1

## 2020-08-19 MED ORDER — LEVALBUTEROL HCL 1.25 MG/0.5ML IN NEBU
1.2500 mg | INHALATION_SOLUTION | Freq: Four times a day (QID) | RESPIRATORY_TRACT | Status: DC | PRN
  Filled 2020-08-19: qty 0.5

## 2020-08-19 MED ORDER — AMIODARONE LOAD VIA INFUSION
150.0000 mg | Freq: Once | INTRAVENOUS | Status: AC
  Administered 2020-08-19: 150 mg via INTRAVENOUS
  Filled 2020-08-19 (×3): qty 83.34

## 2020-08-19 MED ORDER — SODIUM CHLORIDE 0.9 % IV BOLUS
500.0000 mL | Freq: Once | INTRAVENOUS | Status: AC
  Administered 2020-08-19: 500 mL via INTRAVENOUS

## 2020-08-19 MED ORDER — TRAZODONE HCL 50 MG PO TABS
50.0000 mg | ORAL_TABLET | Freq: Every evening | ORAL | Status: AC | PRN
Start: 2020-08-19 — End: ?

## 2020-08-19 MED ORDER — ORAL CARE MOUTH RINSE
15.0000 mL | Freq: Two times a day (BID) | OROMUCOSAL | Status: DC
  Administered 2020-08-20: 15 mL via OROMUCOSAL

## 2020-08-19 MED ORDER — AMIODARONE HCL IN DEXTROSE 360-4.14 MG/200ML-% IV SOLN
30.0000 mg/h | INTRAVENOUS | Status: DC
  Administered 2020-08-20 (×3): 30 mg/h via INTRAVENOUS
  Filled 2020-08-19 (×3): qty 200

## 2020-08-19 MED ORDER — AMIODARONE HCL IN DEXTROSE 360-4.14 MG/200ML-% IV SOLN
60.0000 mg/h | INTRAVENOUS | Status: AC
  Administered 2020-08-19 (×2): 60 mg/h via INTRAVENOUS
  Filled 2020-08-19: qty 200

## 2020-08-19 MED ORDER — CARVEDILOL 12.5 MG PO TABS
25.0000 mg | ORAL_TABLET | Freq: Two times a day (BID) | ORAL | Status: DC
  Administered 2020-08-19: 25 mg via ORAL
  Filled 2020-08-19: qty 1

## 2020-08-19 MED ORDER — POTASSIUM CHLORIDE CRYS ER 20 MEQ PO TBCR: 20.0000 meq | EXTENDED_RELEASE_TABLET | Freq: Once | ORAL | Status: DC

## 2020-08-19 MED ORDER — AMIODARONE HCL IN DEXTROSE 360-4.14 MG/200ML-% IV SOLN: 60.0000 mg/h | INTRAVENOUS | Status: DC

## 2020-08-19 MED ORDER — SPIRONOLACTONE 25 MG PO TABS: 25.0000 mg | ORAL_TABLET | Freq: Every day | ORAL | 1 refills | Status: AC

## 2020-08-19 MED ORDER — ADENOSINE 6 MG/2ML IV SOLN
12.0000 mg | Freq: Once | INTRAVENOUS | Status: AC
  Administered 2020-08-19: 12 mg via INTRAVENOUS
  Filled 2020-08-19: qty 4

## 2020-08-19 MED ORDER — AMIODARONE LOAD VIA INFUSION: 150.0000 mg | Freq: Once | INTRAVENOUS | 0 refills | Status: DC

## 2020-08-19 MED ORDER — FUROSEMIDE 10 MG/ML IJ SOLN: 40.0000 mg | Freq: Two times a day (BID) | INTRAMUSCULAR | 0 refills | Status: DC

## 2020-08-19 NOTE — Progress Notes (Signed)
   08/19/20 2100  Clinical Encounter Type  Visited With Family  Visit Type Follow-up  Spiritual Encounters  Spiritual Needs Emotional  Daryel November engaged Mr. Cadenhead family after transfer to ICU. Spoke primarily with his sister who shared the experience of the recent loss of their father. Chaplain Burris encouraged her to rest and practice good self-care. The sister asked for prayer and Daryel November offered assurance of this spiritual support.

## 2020-08-19 NOTE — Progress Notes (Signed)
NG tube placement attempted per verbal order from Dr. Loanne Drilling, MD without success. PO meds unable to be given at this time due to the patient being drowsy. Will continue to monitor.  Cameron Ali, RN

## 2020-08-19 NOTE — Progress Notes (Signed)
   08/19/20 1021  Notify: Provider  Provider Name/Title Murray Hodgkins, FNP  Date Provider Notified 08/19/20  Time Provider Notified [id number]  Notification Type Page  Notification Reason Change in status (Heart Rate)  Provider response Other (Comment) (Awaiting response)

## 2020-08-19 NOTE — Progress Notes (Signed)
Hematology/Oncology Inpatient Progress Note [facility]  [phone] [phone]  Gregg Williams   DOB:Jan 25, 1956   PQ#:[id number]   JFH#:[id number]  HPI: Patient is 65 year old male with medical history significant for HTN, HLD, pulmonary hypertension, on oxygen at home, CAD, obesity, BMI 41, diabetes, thrombocytopenia, chronic back pain who is currently admitted for acutely worse lower back pain. He was found to be pancytopenic.  MRI wo contrast in er on 08/14/20 showed diffusely abnormal bone marrow new since 2020. No evidence of fracture or cord compression.   Ultrasound suggestive of cirrhosis  LDH elevated. Peripheral smear concerning for immature cells. Bone marrow was recommended and performed on 08/18/20. Delayed x 1 day due to episode of atrial tachycardia and new a fib. Has been followed by cardiology and on telemetry.     Interval History: Patient continues to be admitted. He underwent bone marrow biopsy yesterday. Had run of a fib this morning and was given IV metoprolol. Counts have continued to drop today. Admission hmg around 11. Now 9.1, ANC 1200, now 700. Platelet 41, now 25. Of note, blood work from May 2022 shows no evidence of anemia or neutropenia. Platelet count 104.    Objective:  Vitals:   08/19/20 1547 08/19/20 1548  BP: 125/61   Pulse: (!) 44 73  Resp: 20   Temp: (!) 97.5 F (36.4 C)   SpO2: 97% 96%    Body mass index is 41.36 kg/m.  Intake/Output Summary (Last 24 hours) at 08/19/2020 1619 Last data filed at 08/19/2020 1417 Gross per 24 hour  Intake 720 ml  Output 1000 ml  Net -280 ml   GENERAL: laying in bed. Lethargic appearing. Patient's grandson present. Daughter on phone.  EYES: no pallor or icterus LYMPH: no palpable lymphadenopathy in the axillary or inguinal regions LUNGS: decreased. HEART/CVS: regular rate and rhythm ABDOMEN: obese. Exam limited.  Musculoskeletal: ambulatory PSYCH: flat affect NEURO:  oriented SKIN: bruising appears more wide spread than previous    CBG (last 3)  Recent Labs    08/18/20 2021 08/19/20 0747 08/19/20 1143  GLUCAP 138* 144* 185*    Labs:  Lab Results  Component Value Date   WBC 1.7 (L) 08/19/2020   HGB 9.1 (L) 08/19/2020   HCT 26.3 (L) 08/19/2020   MCV 89.8 08/19/2020   PLT 25 (LL) 08/19/2020   NEUTROABS 0.7 (L) 08/19/2020    CMP Latest Ref Rng & Units 08/19/2020 08/18/2020 08/17/2020  Glucose 70 - 99 mg/dL 146(H) 144(H) 166(H)  BUN 8 - 23 mg/dL 27(H) 17 11  Creatinine 0.61 - 1.24 mg/dL 0.61 0.56(L) 0.41(L)  Sodium 135 - 145 mmol/L 133(L) 133(L) 134(L)  Potassium 3.5 - 5.1 mmol/L 4.5 4.3 3.7  Chloride 98 - 111 mmol/L 90(L) 91(L) 95(L)  CO2 22 - 32 mmol/L 35(H) 29 29  Calcium 8.9 - 10.3 mg/dL 8.4(L) 8.4(L) 8.4(L)  Total Protein 6.5 - 8.1 g/dL - - -  Total Bilirubin 0.3 - 1.2 mg/dL - - -  Alkaline Phos 38 - 126 U/L - - -  AST 15 - 41 U/L - - -  ALT 0 - 44 U/L - - -   Urine Studies No results for input(s): UHGB, CRYS in the last 72 hours.  Invalid input(s): UACOL, UAPR, USPG, UPH, UTP, UGL, UKET, UBIL, UNIT, UROB, ULEU, UEPI, UWBC, URBC, UBAC, CAST, UCOM, [place]  Basic Metabolic Panel: Recent Labs  Lab 08/14/20 1211 08/15/20 9373 08/16/20 4287 08/16/20 6811 08/17/20 5726 08/18/20 0454 08/18/20 0813  08/19/20 0506  NA  --  136  --  135 134* 133*  --  133*  K  --  3.9  --  3.5 3.7 4.3  --  4.5  CL  --  100  --  97* 95* 91*  --  90*  CO2  --  26  --  _0 --  35*  GLUCOSE  --  133*  --  198* 166* 144*  --  146*  BUN  --  12  --  _1 --  27*  CREATININE  --  0.48* 0.46* 0.55* 0.41* 0.56*  --  0.61  CALCIUM  --  8.2*  --  8.3* 8.4* 8.4*  --  8.4*  MG 2.1  --   --   --   --   --  2.0  --    GFR Estimated Creatinine Clearance: 122.4 mL/min (by C-G formula based on SCr of 0.61 mg/dL). Liver Function Tests: Recent Labs  Lab 08/14/20 1023  AST 23  ALT 13  ALKPHOS 89  BILITOT 1.0  PROT 6.8  ALBUMIN 2.4*   No  results for input(s): LIPASE, AMYLASE in the last 168 hours. Recent Labs  Lab 08/18/20 0813 08/19/20 1013  AMMONIA 36* 47*   Coagulation profile Recent Labs  Lab 08/14/20 1211 08/15/20 1030  INR 1.0 1.1    CBC: Recent Labs  Lab 08/14/20 1023 08/15/20 0813 08/16/20 0937 08/17/20 0442 08/18/20 0454 08/19/20 0506  WBC 2.3* 2.0* 2.1* 2.3* 2.2* 1.7*  NEUTROABS 1.2*  --   --  1.2* 1.3* 0.7*  HGB 11.5* 11.0* 11.2* 10.9* 10.0* 9.1*  HCT 34.2* 32.4* 32.4* 31.7* 28.9* 26.3*  MCV 90.7 91.3 88.8 89.8 89.5 89.8  PLT 41* 39* 38* 40* 33* 25*   Cardiac Enzymes: No results for input(s): CKTOTAL, CKMB, CKMBINDEX, TROPONINI in the last 168 hours. BNP: Invalid input(s): POCBNP CBG: Recent Labs  Lab 08/18/20 1152 08/18/20 1633 08/18/20 2021 08/19/20 0747 08/19/20 1143  GLUCAP 135* 133* 138* 144* 185*   D-Dimer No results for input(s): DDIMER in the last 72 hours. Hgb A1c No results for input(s): HGBA1C in the last 72 hours. Lipid Profile No results for input(s): CHOL, HDL, LDLCALC, TRIG, CHOLHDL, LDLDIRECT in the last 72 hours. Thyroid function studies Recent Labs    08/18/20 0454  TSH 2.498   Anemia work up No results for input(s): VITAMINB12, FOLATE, FERRITIN, TIBC, IRON, RETICCTPCT in the last 72 hours. Microbiology Recent Results (from the past 240 hour(s))  Culture, blood (routine x 2)     Status: None   Collection Time: 08/14/20  2:19 PM   Specimen: BLOOD  Result Value Ref Range Status   Specimen Description BLOOD RIGHT ANTECUBITAL  Final   Special Requests   Final    BOTTLES DRAWN AEROBIC AND ANAEROBIC Blood Culture adequate volume   Culture   Final    NO GROWTH 5 DAYS Performed at [facility], [address], [place], [place] [address]    Report Status 08/19/2020 FINAL  Final  Culture, blood (routine x 2)     Status: None   Collection Time: 08/14/20  2:25 PM   Specimen: BLOOD  Result Value Ref Range Status   Specimen Description BLOOD RIGHT  HAND  Final   Special Requests   Final    BOTTLES DRAWN AEROBIC AND ANAEROBIC Blood Culture adequate volume   Culture   Final    NO GROWTH 5 DAYS Performed at  [facility], [address], [place], [place] [address]    Report Status 08/19/2020 FINAL  Final  SARS CORONAVIRUS 2 (TAT 6-24 HRS) Nasopharyngeal Nasopharyngeal Swab     Status: None   Collection Time: 08/14/20  3:46 PM   Specimen: Nasopharyngeal Swab  Result Value Ref Range Status   SARS Coronavirus 2 NEGATIVE NEGATIVE Final    Comment: (NOTE) SARS-CoV-2 target nucleic acids are NOT DETECTED.  The SARS-CoV-2 RNA is generally detectable in upper and lower respiratory specimens during the acute phase of infection. Negative results do not preclude SARS-CoV-2 infection, do not rule out co-infections with other pathogens, and should not be used as the sole basis for treatment or other patient management decisions. Negative results must be combined with clinical observations, patient history, and epidemiological information. The expected result is Negative.  Fact Sheet for Patients: SugarRoll.be  Fact Sheet for Healthcare Providers: https://www.woods-mathews.com/  This test is not yet approved or cleared by the [place] FDA and  has been authorized for detection and/or diagnosis of SARS-CoV-2 by FDA under an Emergency Use Authorization (EUA). This EUA will remain  in effect (meaning this test can be used) for the duration of the COVID-19 declaration under Se ction 564(b)(1) of the Act, 21 U.S.C. section 360bbb-3(b)(1), unless the authorization is terminated or revoked sooner.  Performed at [facility], [place] [address] [place], [place] [address]    08/15/20-  LDH 573 Fibrinogen 739    Studies:  CT HEAD WO CONTRAST  Result Date: 08/19/2020 CLINICAL DATA:  Delirium. EXAM: CT HEAD WITHOUT CONTRAST TECHNIQUE: Contiguous axial images were obtained from the  base of the skull through the vertex without intravenous contrast. COMPARISON:  No pertinent prior exams available for comparison. FINDINGS: Brain: Cerebral volume is normal for age. There is no acute intracranial hemorrhage. No demarcated cortical infarct. No extra-axial fluid collection. No evidence of an intracranial mass. No midline shift. Vascular: No hyperdense vessel.  Atherosclerotic calcifications. Skull: Normal. Negative for fracture or focal lesion. Sinuses/Orbits: Visualized orbits show no acute finding. Mild left sphenoid sinus mucosal thickening. Other: Trace fluid within the right mastoid air cells. IMPRESSION: No evidence of acute intracranial abnormality. Mild left sphenoid sinus mucosal thickening. Trace right mastoid effusion. 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 ANESTHESIA/SEDATION: Moderate (conscious) sedation was employed during this procedure. A total of Versed 0 mg and Fentanyl 25 mcg was administered intravenously. Moderate Sedation Time: None minutes. The patient's level of consciousness and vital signs were monitored continuously by radiology nursing throughout the procedure under my direct supervision. FLUOROSCOPY TIME:  CT COMPLICATIONS: None PROCEDURE: Informed written consent was obtained from the patient after a thorough discussion of the procedural risks, benefits and alternatives. All questions were addressed. Maximal Sterile Barrier Technique was utilized including caps, mask, sterile gowns, sterile gloves, sterile drape, hand hygiene and skin antiseptic. A timeout was performed prior to the initiation of the procedure. The procedure risks, benefits, and alternatives were explained to the patient. Questions regarding the procedure were  encouraged and answered. The patient understands and consents to the procedure. Scout CT of the pelvis was performed for surgical planning purposes. The right posterior pelvis was prepped with Chlorhexidine in a sterile fashion, and a sterile drape was applied covering the operative field.  A sterile gown and sterile gloves were used for the procedure. Local anesthesia was provided with 1% Lidocaine. Posterior iliac bone was targeted for biopsy. The skin and subcutaneous tissues were infiltrated with 1% lidocaine without epinephrine. A small stab incision was made with an 11 blade scalpel, and an 11 gauge Murphy needle was advanced with CT guidance to the posterior cortex. Manual forced was used to advance the needle through the posterior cortex and the stylet was removed. A bone marrow aspirate was retrieved and passed to a cytotechnologist in the room. The Murphy needle was then advanced without the stylet for a core biopsy. The core biopsy was retrieved and also passed to a cytotechnologist. Manual pressure was used for hemostasis and a sterile dressing was placed. No complications were encountered no significant blood loss was encountered. Patient tolerated the procedure well and remained hemodynamically stable throughout. IMPRESSION: Status post CT-guided bone marrow biopsy, with tissue specimen sent to pathology for complete histopathologic analysis Signed, Dulcy Fanny. Earleen Newport, DO [facility] Electronically Signed   By: Corrie Mckusick D.O.   On: 08/18/2020 11:46    Assessment & Plan: 65 y.o. with obesity, hypertension, pulmonary hypertension, atrial tachycardia, and pancytopenia.   Myeloproliferative Disorder- pancytopenia new since May 2022. Elevated LDH, abnormal MRI, declining counts. I spoke to pathologist for preliminary read on bone marrow. Tissue is widely necrotic. Mononuclear, B Cell. Preliminary flow cytometry along with clinical findings  highly suspicious for acute leukemia/ALL. Dr. Rogue Bussing notified and he accompanied to the bedside along with Dr. Grandville Silos to discuss with patient and family. Given his rapidly deteriorating lab counts and ominous findings on bone marrow, recommendation is for transfer to [facility] for evaluation and management. Patient and family are in agreement.   I spoke to [facility] to assist with transfer.   Verlon Au, NP 08/19/2020  5:02 PM [facility]

## 2020-08-19 NOTE — Progress Notes (Signed)
Mobility Specialist - Progress Note   08/19/20 1600  Mobility  Activity Transferred to/from [facility]  Level of Assistance Minimal assist, patient does 75% or more  Assistive Device Front wheel walker  Distance Ambulated (ft) 2 ft  Mobility Out of bed for toileting  Mobility Response Tolerated well  Mobility performed by Mobility specialist  $Mobility charge 1 Mobility    Pt transferred to [facility] with RW and minA. No LOB. ModA to sit EOB, minA to stand using momentum from elevated bed height. Pt left on BSC with NT present at exit.    Kathee Delton Mobility Specialist 08/19/20, 4:49 PM

## 2020-08-19 NOTE — Progress Notes (Addendum)
PROGRESS NOTE    Gregg Williams  OEH:[id number] DOB: April 19, 1955 DOA: 08/14/2020 PCP: Leonard Downing, MD   No chief complaint on file.   Brief Narrative:  Gregg Williams is an 64 y.o. male with medical history significant of HTN, HLD, pulmonary hypertension on 1.5 L oxygen at home, CAD, obesity, thrombocytopenia, chronic back pain, who presents with lower back pain and abnormal lab. Concern for myeloproliferative disease and BMB pending.  Stay complicated by atrial tachycardia and worsening dyspnea.     Assessment & Plan:   Principal Problem:   Lower back pain Active Problems:   Diabetes mellitus without complication (HCC)   Thrombocytopenia (HCC)   Pulmonary hypertension (HCC)   Chronic respiratory failure with hypoxia (HCC)   Hypokalemia   CAD (coronary artery disease)   SIRS (systemic inflammatory response syndrome) (HCC)   Obesity, Class III, BMI 40-49.9 (morbid obesity) (HCC)   Pancytopenia (HCC)   Atrial tachycardia (HCC)   Leg edema   Acute metabolic encephalopathy   1 lower back pain -Patient noted to have chronic low back pain for greater than 6 years status post spinal surgery. -Patient noted to have had progressive worsening back pain for the past several weeks with radiation of back pain to the right leg laterally with also some complaints of mild right leg weakness. -No bladder or bowel incontinence. -It is noted that patient had some elevated markers at PCPs office with a CRP of 30, sed rate of 130, alk phosphatase of 140s. -MRI L-spine negative for spinal cord compression, showed concerns for myeloproliferative disorder, also noted was right sided disc protrusion L5-S1 with mild progression since 2020, flattening and displacement of right S1 nerve root.  Multilevel degenerative change throughout the lumbar spine otherwise similar to prior MRI. -Patient currently denies any significant pain. -Continue current pain regimen, with Percocet, continue Lidoderm  patch.   -Status post bone marrow biopsy done 08/18/2020 pending.  -We will need outpatient follow-up with primary neurosurgeon.  2.  Pancytopenia -MRI L-spine with diffuse abnormal bone marrow developed since 2020 with concerns for myeloproliferative disorder. -Patient with no overt bleeding. -Hematology consulted and patient status post bone marrow biopsy 08/18/2020.   -Patient with no bleeding  -Counts trending down.  White count at 1.7, hemoglobin at 9.1, platelet count at 25. -Transfusion threshold hemoglobin < 7, platelet count < 10 or  > 20 with active bleeding. -Hold aspirin. -Per hematology/oncology.  3.  Acute metabolic encephalopathy -Per RN patient with some concerns for confusion yesterday morning which improved as the day went on.  Patient currently sleeping but easily arousable.   -No asterixis noted.   -Ammonia level slightly elevated at 36 on 08/18/2020.   -Repeat ammonia levels pending. -Abdominal ultrasound with findings concerning for possible cirrhosis. -Patient also noted to be on narcotic pain medication for problem #1. -Percocet changed to 1 tablet every 6 hours as needed.   -Continue lactulose twice daily x2 days.   -No focal neurological deficits.   -No signs of withdrawal.   -CT head. -Supportive care.  4.  Paroxysmal atrial tachycardia -Patient seen in consultation by cardiology.  Patient noted to have some PACs. -Heart rate initially improving on Coreg 12.5 mg twice daily.  -Heart rate noted to be in the 150s this morning, patient asymptomatic and sleeping.  -Lopressor 5 mg IV x1 given.  -Increase Coreg to 25 mg p.o. twice daily.  -Cardiology following.  5.  Acute on chronic diastolic CHF exacerbation/pulmonary hypertension -Patient assessed by cardiology and  is noted that patient has some crackles on examination with lower extremity edema on 08/17/2020. -Urine output of 1.4 L over the past 24 hours.   -Continue IV Lasix, Aldactone, Coreg.   -Per  cardiology.   6.  Possible cirrhosis per abdominal ultrasound -Abdominal ultrasound with hepatomegaly and concerns for possible cirrhosis. -Patient denies any history of cirrhosis. -Continue current regimen of IV Lasix, Aldactone.   -Continue lactulose twice daily for another 24 hours due to concerns for acute metabolic encephalopathy.   -Will need outpatient follow-up with GI.  7.  Lower extremity edema -Questionable etiology differential included possible cirrhosis, versus CHF, versus morbid obesity. -Improving with diuresis. -Urine output of 1.4 L over the past 24 hours.  -Continue current dose of IV Lasix, Aldactone.   -Cardiology following.    8.  Diabetes mellitus type 2 without complication -Hemoglobin A1c 7.1. -Patient noted to be on Jardiance and Amaryl. -CBG 144 this morning  -Continue SSI.   9.  Hypokalemia -Repleted.  Potassium of 4.5.    10.  Hyperlipidemia -Patient not on a statin. -Outpatient follow-up with PCP.  11.  Coronary artery disease -Stable. -Continue Coreg, Lasix.  Continue Aldactone.   -Hold aspirin due to thrombocytopenia.   -Cardiology following.   12.  Morbid obesity  13.  Chronic respiratory failure -On 1.5 to 2 L nasal cannula chronically. -Outpatient follow-up.   DVT prophylaxis: SCDs Code Status: Full Family Communication: No family at bedside.  Disposition:   Status is: Inpatient  Remains inpatient appropriate because:Altered mental status  Dispo: The patient is from: Home              Anticipated d/c is to: Home              Patient currently is not medically stable to d/c.   Difficult to place patient No       Consultants:  Cardiology: Dr. Saunders Revel 08/17/2020 Interventional radiology: Dr. Anselm Pancoast 08/16/2020 Hematology/oncology: Dr. Rogue Bussing 08/16/2020  Procedures:  Chest x-ray 08/14/2020 MRI L-spine 08/14/2020 Abdominal ultrasound 08/15/2020 CT bone marrow biopsy and aspiration 08/18/2020 per Dr. Earleen Newport,  IR     Antimicrobials:  IV cefepime 08/14/2020>>>> 08/16/2020 IV vancomycin 08/14/2020>>>>> 08/16/2020   Subjective: Patient sleeping but arousable.  Heart rate noted in the 150s per RN and noted on telemetry.  Patient received Coreg this morning and Lopressor 5 mg IV x1. Patient denies any chest pain, no shortness of breath, no abdominal pain, no bleeding.  Objective: Vitals:   08/18/20 1950 08/19/20 0506 08/19/20 0822 08/19/20 0929  BP: 122/70 138/64 (!) 141/113 108/81  Pulse: 74 75 69 (!) 154  Resp: $Remo'18 18 20 20  'aOKSZ$ Temp: 98.1 F (36.7 C) 97.8 F (36.6 C) 98.1 F (36.7 C) 98.1 F (36.7 C)  TempSrc: Oral Oral Oral   SpO2: 96% 96% 95% 99%  Weight:      Height:        Intake/Output Summary (Last 24 hours) at 08/19/2020 0947 Last data filed at 08/18/2020 1943 Gross per 24 hour  Intake 480 ml  Output 600 ml  Net -120 ml    Filed Weights   08/14/20 1008  Weight: 123.4 kg    Examination:  General exam: NAD.  Sleeping. Respiratory system: Clear to auscultation anterior lung fields.  No wheezing,. Respiratory effort normal. Cardiovascular system: Tachycardia.  No JVD, no murmurs rubs or gallops.  2+ bilateral lower extremity edema.  Gastrointestinal system: Abdomen is soft, distended, nontender, positive bowel sounds.  No rebound.  No guarding.  Central nervous system: Alert and oriented x2. No focal neurological deficits. Extremities: Symmetric 5 x 5 power. Skin: No rashes, lesions or ulcers Psychiatry: Judgement and insight appear poor to fair. Mood & affect appropriate.     Data Reviewed: I have personally reviewed following labs and imaging studies  CBC: Recent Labs  Lab 08/14/20 1023 08/15/20 0813 08/16/20 0937 08/17/20 0442 08/18/20 0454 08/19/20 0506  WBC 2.3* 2.0* 2.1* 2.3* 2.2* 1.7*  NEUTROABS 1.2*  --   --  1.2* 1.3* 0.7*  HGB 11.5* 11.0* 11.2* 10.9* 10.0* 9.1*  HCT 34.2* 32.4* 32.4* 31.7* 28.9* 26.3*  MCV 90.7 91.3 88.8 89.8 89.5 89.8  PLT 41* 39*  38* 40* 33* 25*     Basic Metabolic Panel: Recent Labs  Lab 08/14/20 1211 08/15/20 0813 08/16/20 0657 08/16/20 0937 08/17/20 0442 08/18/20 0454 08/18/20 0813 08/19/20 0506  NA  --  136  --  135 134* 133*  --  133*  K  --  3.9  --  3.5 3.7 4.3  --  4.5  CL  --  100  --  97* 95* 91*  --  90*  CO2  --  26  --  $R'27 29 29  'tb$ --  35*  GLUCOSE  --  133*  --  198* 166* 144*  --  146*  BUN  --  12  --  $R'12 11 17  'rN$ --  27*  CREATININE  --  0.48* 0.46* 0.55* 0.41* 0.56*  --  0.61  CALCIUM  --  8.2*  --  8.3* 8.4* 8.4*  --  8.4*  MG 2.1  --   --   --   --   --  2.0  --      GFR: Estimated Creatinine Clearance: 122.4 mL/min (by C-G formula based on SCr of 0.61 mg/dL).  Liver Function Tests: Recent Labs  Lab 08/14/20 1023  AST 23  ALT 13  ALKPHOS 89  BILITOT 1.0  PROT 6.8  ALBUMIN 2.4*     CBG: Recent Labs  Lab 08/18/20 0735 08/18/20 1152 08/18/20 1633 08/18/20 2021 08/19/20 0747  GLUCAP 160* 135* 133* 138* 144*      Recent Results (from the past 240 hour(s))  Culture, blood (routine x 2)     Status: None   Collection Time: 08/14/20  2:19 PM   Specimen: BLOOD  Result Value Ref Range Status   Specimen Description BLOOD RIGHT ANTECUBITAL  Final   Special Requests   Final    BOTTLES DRAWN AEROBIC AND ANAEROBIC Blood Culture adequate volume   Culture   Final    NO GROWTH 5 DAYS Performed at [facility], [address], [place], [place] [address]    Report Status 08/19/2020 FINAL  Final  Culture, blood (routine x 2)     Status: None   Collection Time: 08/14/20  2:25 PM   Specimen: BLOOD  Result Value Ref Range Status   Specimen Description BLOOD RIGHT HAND  Final   Special Requests   Final    BOTTLES DRAWN AEROBIC AND ANAEROBIC Blood Culture adequate volume   Culture   Final    NO GROWTH 5 DAYS Performed at [facility], [address], [place], [place] [address]    Report Status 08/19/2020 FINAL  Final  SARS CORONAVIRUS 2 (TAT 6-24 HRS)  Nasopharyngeal Nasopharyngeal Swab     Status: None   Collection Time: 08/14/20  3:46 PM   Specimen: Nasopharyngeal Swab  Result Value Ref Range  Status   SARS Coronavirus 2 NEGATIVE NEGATIVE Final    Comment: (NOTE) SARS-CoV-2 target nucleic acids are NOT DETECTED.  The SARS-CoV-2 RNA is generally detectable in upper and lower respiratory specimens during the acute phase of infection. Negative results do not preclude SARS-CoV-2 infection, do not rule out co-infections with other pathogens, and should not be used as the sole basis for treatment or other patient management decisions. Negative results must be combined with clinical observations, patient history, and epidemiological information. The expected result is Negative.  Fact Sheet for Patients: SugarRoll.be  Fact Sheet for Healthcare Providers: https://www.woods-mathews.com/  This test is not yet approved or cleared by the [place] FDA and  has been authorized for detection and/or diagnosis of SARS-CoV-2 by FDA under an Emergency Use Authorization (EUA). This EUA will remain  in effect (meaning this test can be used) for the duration of the COVID-19 declaration under Se ction 564(b)(1) of the Act, 21 U.S.C. section 360bbb-3(b)(1), unless the authorization is terminated or revoked sooner.  Performed at [facility], [place] [address], [place], [place] [address]           Radiology Studies: CT BONE MARROW BIOPSY & ASPIRATION  Result Date: 08/18/2020 INDICATION: 65 year old male with possible myelodysplastic syndrome referred for biopsy EXAM: CT BONE MARROW BIOPSY AND ASPIRATION MEDICATIONS: None. ANESTHESIA/SEDATION: Moderate (conscious) sedation was employed during this procedure. A total of Versed 0 mg and Fentanyl 25 mcg was administered intravenously. Moderate Sedation Time: None minutes. The patient's level of consciousness and vital signs were monitored continuously  by radiology nursing throughout the procedure under my direct supervision. FLUOROSCOPY TIME:  CT COMPLICATIONS: None PROCEDURE: Informed written consent was obtained from the patient after a thorough discussion of the procedural risks, benefits and alternatives. All questions were addressed. Maximal Sterile Barrier Technique was utilized including caps, mask, sterile gowns, sterile gloves, sterile drape, hand hygiene and skin antiseptic. A timeout was performed prior to the initiation of the procedure. The procedure risks, benefits, and alternatives were explained to the patient. Questions regarding the procedure were encouraged and answered. The patient understands and consents to the procedure. Scout CT of the pelvis was performed for surgical planning purposes. The right posterior pelvis was prepped with Chlorhexidine in a sterile fashion, and a sterile drape was applied covering the operative field. A sterile gown and sterile gloves were used for the procedure. Local anesthesia was provided with 1% Lidocaine. Posterior iliac bone was targeted for biopsy. The skin and subcutaneous tissues were infiltrated with 1% lidocaine without epinephrine. A small stab incision was made with an 11 blade scalpel, and an 11 gauge Murphy needle was advanced with CT guidance to the posterior cortex. Manual forced was used to advance the needle through the posterior cortex and the stylet was removed. A bone marrow aspirate was retrieved and passed to a cytotechnologist in the room. The Murphy needle was then advanced without the stylet for a core biopsy. The core biopsy was retrieved and also passed to a cytotechnologist. Manual pressure was used for hemostasis and a sterile dressing was placed. No complications were encountered no significant blood loss was encountered. Patient tolerated the procedure well and remained hemodynamically stable throughout. IMPRESSION: Status post CT-guided bone marrow biopsy, with tissue specimen  sent to pathology for complete histopathologic analysis Signed, Dulcy Fanny. Earleen Newport, DO [facility] Radiology Electronically Signed   By: Corrie Mckusick D.O.   On: 08/18/2020 11:46  Scheduled Meds:  aspirin EC  81 mg Oral Daily   carvedilol  12.5 mg Oral BID WC   cyanocobalamin  1,000 mcg Intramuscular Daily   docusate sodium  100 mg Oral Daily   furosemide  40 mg Intravenous BID   insulin aspart  0-5 Units Subcutaneous QHS   insulin aspart  0-9 Units Subcutaneous TID WC   lactulose  20 g Oral BID   lidocaine  1 patch Transdermal Q24H   mometasone-formoterol  2 puff Inhalation BID   polyethylene glycol  17 g Oral BID   spironolactone  25 mg Oral Daily   Continuous Infusions:   LOS: 4 days    Time spent: 40 minutes    Gregg Seal, MD [facility]   To contact the attending provider between 7A-7P or the covering provider during after hours 7P-7A, please log into the web site [email or web address] and access using universal [place] password for that web site. If you do not have the password, please call the hospital operator.  08/19/2020, 9:47 AM

## 2020-08-19 NOTE — Progress Notes (Signed)
   08/19/20 1845  Assess: MEWS Score  BP 113/69  Pulse Rate (!) 146  O2 Device Nasal Cannula  O2 Flow Rate (L/min) 2 L/min  Assess: MEWS Score  MEWS Temp 0  MEWS Systolic 0  MEWS Pulse 3  MEWS RR 0  MEWS LOC 0  MEWS Score 3  MEWS Score Color Yellow  Notify: Rapid Response  Name of Rapid Response RN Notified Lu Duffel, RN  Date Rapid Response Notified 08/19/20  Time Rapid Response Notified [id number]  Document  Patient Outcome Not stable and remains on department  Progress note created (see row info) Yes  Assess: SIRS CRITERIA  SIRS Temperature  0  SIRS Pulse 1  SIRS Respirations  0  SIRS WBC 0  SIRS Score Sum  1

## 2020-08-19 NOTE — Progress Notes (Signed)
Was called by RN that patient initially in SVT with heart rate sustaining in the 150s. Came and assessed patient. General: NAD Respiration: CTA B anterior lung fields Cardiovascular: Irregularly irregular.  No murmurs rubs or gallops.  No JVD.  1-2+ bilateral lower extremity edema. Extremities: 1-2+ bilateral lower extremity edema Assessment/plan #1 atrial flutter/paroxysmal atrial fib/?  SVT -Check a BMET, magnesium level, EKG. -Carotid massage done with no improvement with heart rate.   -Patient received adenosine 6 mg IV x1 with heart rate very briefly in the 90s but went back up into the 150s.  Adenosine 12 mg IV given x1 with heart rate sustaining in the 150s. -Placed on amiodarone drip with amiodarone 150 mg IV bolus x1 over the next 20 minutes. -Transferred to progressive care unit. -Case discussed with cardiology, Dr. Rockey Situ who is in agreement with current treatment plan. -We will continue amiodarone drip throughout the night even if heart rate improves outpatient converts back to sinus rhythm. -Patient is a poor candidate for anticoagulation due to significant pancytopenia. -Patient currently hemodynamically stable.  If patient becomes unstable may need emergent cardioversion.  Time spent: 60 minutes

## 2020-08-19 NOTE — Progress Notes (Signed)
Progress Note  Patient Name: Gregg Williams Date of Encounter: 08/19/2020  [facility] Cardiologist: Nelva Bush, MD   Subjective   Bone marrow biopsy yesterday Confused, laying supine, lethargic Will wake to stimulation Unaware of tachycardia this morning Telemetry reviewed, atrial fibrillation it would appear starting 8 AM terminating 10 AM after metoprolol IV  Inpatient Medications    Scheduled Meds:  aspirin EC  81 mg Oral Daily   carvedilol  25 mg Oral BID WC   cyanocobalamin  1,000 mcg Intramuscular Daily   docusate sodium  100 mg Oral Daily   furosemide  40 mg Intravenous BID   insulin aspart  0-5 Units Subcutaneous QHS   insulin aspart  0-9 Units Subcutaneous TID WC   lactulose  20 g Oral BID   lidocaine  1 patch Transdermal Q24H   mometasone-formoterol  2 puff Inhalation BID   polyethylene glycol  17 g Oral BID   spironolactone  25 mg Oral Daily   Continuous Infusions:  PRN Meds: acetaminophen, albuterol, alum & mag hydroxide-simeth, dextromethorphan-guaiFENesin, hydrALAZINE, methocarbamol, metoprolol tartrate, naphazoline-glycerin, ondansetron (ZOFRAN) IV, oxyCODONE-acetaminophen, traZODone   Vital Signs    Vitals:   08/18/20 1950 08/19/20 0506 08/19/20 0822 08/19/20 0929  BP: 122/70 138/64 (!) 141/113 108/81  Pulse: 74 75 69 (!) 154  Resp: _0 Temp: 98.1 F (36.7 C) 97.8 F (36.6 C) 98.1 F (36.7 C) 98.1 F (36.7 C)  TempSrc: Oral Oral Oral   SpO2: 96% 96% 95% 99%  Weight:      Height:        Intake/Output Summary (Last 24 hours) at 08/19/2020 1057 Last data filed at 08/19/2020 1044 Gross per 24 hour  Intake 480 ml  Output 300 ml  Net 180 ml   Last 3 Weights 08/14/2020 07/16/2020  Weight (lbs) 272 lb 274 lb  Weight (kg) 123.378 kg 124.286 kg      Telemetry    Normal sinus rhythm, short runs atrial tachycardia, run of atrial fibrillation as detailed above 2 hours rate 150- Personally Reviewed  ECG    - Personally  Reviewed  Physical Exam   GEN: Laying supine, no distress, lethargic, confused, minimally conversant Neck: No JVD Cardiac: RRR, short runs tachycardia, no murmurs, rubs, or gallops.  Respiratory: Scattered Rales GI: Soft, nontender, distended MS: 1+ pitting lower extremity edema; No deformity. Neuro:  Nonfocal  Psych: Normal affect   Labs    High Sensitivity Troponin:   Recent Labs  Lab 08/14/20 1023 08/14/20 1211  TROPONINIHS 7 7      Chemistry Recent Labs  Lab 08/14/20 1023 08/15/20 0813 08/17/20 0442 08/18/20 0454 08/19/20 0506  NA 137   < > 134* 133* 133*  K 3.4*   < > 3.7 4.3 4.5  CL 93*   < > 95* 91* 90*  CO2 31   < > 29 29 35*  GLUCOSE 129*   < > 166* 144* 146*  BUN 14   < > 11 17 27*  CREATININE 0.60*   < > 0.41* 0.56* 0.61  CALCIUM 8.4*   < > 8.4* 8.4* 8.4*  PROT 6.8  --   --   --   --   ALBUMIN 2.4*  --   --   --   --   AST 23  --   --   --   --   ALT 13  --   --   --   --   ALKPHOS 89  --   --   --   --  BILITOT 1.0  --   --   --   --   GFRNONAA >60   < > >60 >60 >60  ANIONGAP 13   < > _0 < > = values in this interval not displayed.     Hematology Recent Labs  Lab 08/17/20 0442 08/18/20 0454 08/19/20 0506  WBC 2.3* 2.2* 1.7*  RBC 3.53* 3.23* 2.93*  HGB 10.9* 10.0* 9.1*  HCT 31.7* 28.9* 26.3*  MCV 89.8 89.5 89.8  MCH 30.9 31.0 31.1  MCHC 34.4 34.6 34.6  RDW 14.3 14.5 14.5  PLT 40* 33* 25*    BNP Recent Labs  Lab 08/14/20 1023  BNP 217.5*     DDimer No results for input(s): DDIMER in the last 168 hours.   Radiology    CT BONE MARROW BIOPSY & ASPIRATION  Result Date: 08/18/2020 INDICATION: 65 year old male with possible myelodysplastic syndrome referred for biopsy EXAM: CT BONE MARROW BIOPSY AND ASPIRATION MEDICATIONS: None. ANESTHESIA/SEDATION: Moderate (conscious) sedation was employed during this procedure. A total of Versed 0 mg and Fentanyl 25 mcg was administered intravenously. Moderate Sedation Time: None minutes.  The patient's level of consciousness and vital signs were monitored continuously by radiology nursing throughout the procedure under my direct supervision. FLUOROSCOPY TIME:  CT COMPLICATIONS: None PROCEDURE: Informed written consent was obtained from the patient after a thorough discussion of the procedural risks, benefits and alternatives. All questions were addressed. Maximal Sterile Barrier Technique was utilized including caps, mask, sterile gowns, sterile gloves, sterile drape, hand hygiene and skin antiseptic. A timeout was performed prior to the initiation of the procedure. The procedure risks, benefits, and alternatives were explained to the patient. Questions regarding the procedure were encouraged and answered. The patient understands and consents to the procedure. Scout CT of the pelvis was performed for surgical planning purposes. The right posterior pelvis was prepped with Chlorhexidine in a sterile fashion, and a sterile drape was applied covering the operative field. A sterile gown and sterile gloves were used for the procedure. Local anesthesia was provided with 1% Lidocaine. Posterior iliac bone was targeted for biopsy. The skin and subcutaneous tissues were infiltrated with 1% lidocaine without epinephrine. A small stab incision was made with an 11 blade scalpel, and an 11 gauge Murphy needle was advanced with CT guidance to the posterior cortex. Manual forced was used to advance the needle through the posterior cortex and the stylet was removed. A bone marrow aspirate was retrieved and passed to a cytotechnologist in the room. The Murphy needle was then advanced without the stylet for a core biopsy. The core biopsy was retrieved and also passed to a cytotechnologist. Manual pressure was used for hemostasis and a sterile dressing was placed. No complications were encountered no significant blood loss was encountered. Patient tolerated the procedure well and remained hemodynamically stable  throughout. IMPRESSION: Status post CT-guided bone marrow biopsy, with tissue specimen sent to pathology for complete histopathologic analysis Signed, Dulcy Fanny. Earleen Newport, DO [facility] Electronically Signed   By: Corrie Mckusick D.O.   On: 08/18/2020 11:46    Cardiac Studies   Echocardiogram  1. Left ventricular ejection fraction, by estimation, is >55%. The left  ventricle has normal function. Left ventricular endocardial border not  optimally defined to evaluate regional wall motion. There is mild left  ventricular hypertrophy. Left  ventricular diastolic function could not be evaluated.   2. Right ventricular systolic function is mildly reduced. The right  ventricular  size is normal. Mildly increased right ventricular wall  thickness. Tricuspid regurgitation signal is inadequate for assessing PA  pressure.   3. The mitral valve is grossly normal. No evidence of mitral valve  regurgitation.   4. The aortic valve was not well visualized. Aortic valve regurgitation  not well assessed.   5. Aortic dilatation noted. There is borderline dilatation of the aortic  root, measuring 39 mm.   6. The inferior vena cava is normal in size with greater than 50%  respiratory variability, suggesting right atrial pressure of 3 mmHg.   Patient Profile     65 y.o. male with history of obesity, hypertension, respiratory failure presenting with chronic back pain, diagnosed with atrial tachycardia and pancytopenia.  Being seen due to atrial tachycardia, lower extremity edema and possible HFpEF  Assessment & Plan    Paroxysmal atrial fibrillation Episode this morning rate 150 lasting 2 hours, terminating with metoprolol IV push Agree with increasing carvedilol up to 25 twice daily Not a good candidate for anticoagulation at this time, high risk for further arrhythmia  not a good candidate for amiodarone  Paroxysmal atrial tachycardia Short runs,  rate typically 110-1 20 Higher dose carvedilol as above  Pulmonary hypertension/acute on chronic heart failure with preserved ejection fraction:  Would suggest continuing IV lasix , spironolactone.   At discharge consider oral torsemide outpt CHF f/u and sleep study.    Essential hypertension:  Higher dose carvedilol as above  type 2 diabetes mellitus/morbid obesity Per internal medicine.  Myeloproliferative disorder/pancytopenia:  Severely depressed platelets Abnormal MRI of the lower back and ongoing pancytopenia.   Seen by hematology/oncology.   Status post bone marrow biopsy   History of chest pain/coronary calcium:  Ruled out in May with nonischemic stress test.   Coronary calcium noted on CT.   Hold off on statin at this time given possible cirrhosis   Case discussed with hospitalist service  Total encounter time more than 35 minutes  Greater than 50% was spent in counseling and coordination of care with the patient   For questions or updates, please contact [place] Please consult [email or web address] for contact info under        Signed, Ida Rogue, MD  08/19/2020, 10:57 AM

## 2020-08-19 NOTE — Progress Notes (Signed)
   08/19/20 1914  Notify: Provider  Provider Name/Title Grayce Sessions  Date Provider Notified 08/19/20  Time Provider Notified [phone]  Notification Type Call  Notification Reason Other (Comment) (Updated regarding patient condition aware of [facility] not having a bed in 1-2 days [facility] may take the patient)

## 2020-08-19 NOTE — Progress Notes (Signed)
   08/19/20 1814  Assess: MEWS Score  Temp 98.3 F (36.8 C)  BP 104/70  Pulse Rate (!) 150  Resp 20  SpO2 98 %  Assess: MEWS Score  MEWS Temp 0  MEWS Systolic 0  MEWS Pulse 3  MEWS RR 0  MEWS LOC 0  MEWS Score 3  MEWS Score Color Yellow  Assess: if the MEWS score is Yellow or Red  Were vital signs taken at a resting state? Yes  Focused Assessment Change from prior assessment (see assessment flowsheet)  Does the patient meet 2 or more of the SIRS criteria? Yes  Does the patient have a confirmed or suspected source of infection? No  MEWS guidelines implemented *See Row Information* Yes  Treat  MEWS Interventions Administered prn meds/treatments  Take Vital Signs  Increase Vital Sign Frequency  Yellow: Q 2hr X 2 then Q 4hr X 2, if remains yellow, continue Q 4hrs  Escalate  MEWS: Escalate Yellow: discuss with charge nurse/RN and consider discussing with provider and RRT  Notify: Charge Nurse/RN  Name of Charge Nurse/RN Notified Rochel Brome, RN  Date Charge Nurse/RN Notified 08/19/20  Time Charge Nurse/RN Notified 1819  Notify: Provider  Provider Name/Title Dr. Grandville Silos  Date Provider Notified 08/19/20  Time Provider Notified 1819  Notification Type Page  Notification Reason Change in status  Provider response No new orders  Date of Provider Response 08/19/20  Time of Provider Response 1820  Notify: Rapid Response  Name of Rapid Response RN Notified paged for 2nd look  Date Rapid Response Notified 08/19/20  Time Rapid Response Notified 3818  Document  Patient Outcome Other (Comment) (PRN Lopressor given)  Progress note created (see row info) Yes  Assess: SIRS CRITERIA  SIRS Temperature  0  SIRS Pulse 1  SIRS Respirations  0  SIRS WBC 0  SIRS Score Sum  1

## 2020-08-19 NOTE — Progress Notes (Signed)
  Amiodarone Drug - Drug Interaction Consult Note  Recommendations: Amiodarone has interactions with furosemide and spironolactone (hypotension associated), metoprolol (bradycardia), trazodone (QT prolonging). Monitor HR/BP and Qtc; consider maintaining K > 4 and Mg > 2.   Amiodarone is metabolized by the cytochrome P450 system and therefore has the potential to cause many drug interactions. Amiodarone has an average plasma half-life of 50 days (range 20 to 100 days).   There is potential for drug interactions to occur several weeks or months after stopping treatment and the onset of drug interactions may be slow after initiating amiodarone.   []  Statins: Increased risk of myopathy. Simvastatin- restrict dose to 20mg  daily. Other statins: counsel patients to report any muscle pain or weakness immediately.  []  Anticoagulants: Amiodarone can increase anticoagulant effect. Consider warfarin dose reduction. Patients should be monitored closely and the dose of anticoagulant altered accordingly, remembering that amiodarone levels take several weeks to stabilize.  []  Antiepileptics: Amiodarone can increase plasma concentration of phenytoin, the dose should be reduced. Note that small changes in phenytoin dose can result in large changes in levels. Monitor patient and counsel on signs of toxicity.  [x]  Beta blockers: increased risk of bradycardia, AV block and myocardial depression. Sotalol - avoid concomitant use.  []   Calcium channel blockers (diltiazem and verapamil): increased risk of bradycardia, AV block and myocardial depression.  []   Cyclosporine: Amiodarone increases levels of cyclosporine. Reduced dose of cyclosporine is recommended.  []  Digoxin dose should be halved when amiodarone is started.  [x]  Diuretics: increased risk of cardiotoxicity if hypokalemia occurs.  []  Oral hypoglycemic agents (glyburide, glipizide, glimepiride): increased risk of hypoglycemia. Patient's glucose levels  should be monitored closely when initiating amiodarone therapy.   [x]  Drugs that prolong the QT interval:  Torsades de pointes risk may be increased with concurrent use - avoid if possible.  Monitor QTc, also keep magnesium/potassium WNL if concurrent therapy can't be avoided.  Antibiotics: e.g. fluoroquinolones, erythromycin.  Antiarrhythmics: e.g. quinidine, procainamide, disopyramide, sotalol.  Antipsychotics: e.g. phenothiazines, haloperidol.   Lithium, tricyclic antidepressants, and methadone.   Sherilyn Banker, PharmD Pharmacy Resident  08/19/2020 7:41 PM

## 2020-08-19 NOTE — Progress Notes (Signed)
   08/19/20 1146  Notify: Provider  Provider Name/Title Dr. Gollan-Cardiology  Date Provider Notified 08/19/20  Time Provider Notified 1145  Notification Type Page  Notification Reason Change in status;Other (Comment) (Heart rate)  Provider response No new orders  Date of Provider Response 08/19/20  Time of Provider Response 1145

## 2020-08-19 NOTE — Progress Notes (Addendum)
   08/19/20 0929  Assess: MEWS Score  Temp 98.1 F (36.7 C)  BP 108/81  Pulse Rate (!) 154  Resp 20  SpO2 99 %  O2 Device Nasal Cannula  O2 Flow Rate (L/min) 2 L/min  Assess: MEWS Score  MEWS Temp 0  MEWS Systolic 0  MEWS Pulse 3  MEWS RR 0  MEWS LOC 0  MEWS Score 3  MEWS Score Color Yellow  Assess: if the MEWS score is Yellow or Red  Were vital signs taken at a resting state? Yes  Focused Assessment Change from prior assessment (see assessment flowsheet)  Does the patient meet 2 or more of the SIRS criteria? Yes  Does the patient have a confirmed or suspected source of infection? No  MEWS guidelines implemented *See Row Information* Yes  Treat  MEWS Interventions Administered prn meds/treatments  Pain Scale 0-10  Pain Score 0  Patients response to intervention Decreased (Heart rate 84 at 1016)  Take Vital Signs  Increase Vital Sign Frequency  Yellow: Q 2hr X 2 then Q 4hr X 2, if remains yellow, continue Q 4hrs  Escalate  MEWS: Escalate Yellow: discuss with charge nurse/RN and consider discussing with provider and RRT  Notify: Charge Nurse/RN  Name of Charge Nurse/RN Notified Rochel Brome, RN  Date Charge Nurse/RN Notified 08/19/20  Time Charge Nurse/RN Notified 0940  Notify: Provider  Provider Name/Title Dr. Grandville Silos  Date Provider Notified 08/19/20  Time Provider Notified [phone]  Notification Type Page  Notification Reason Change in status;Other (Comment) (Heart rate 154 & confusion)  Provider response At bedside;No new orders  Date of Provider Response 08/19/20  Time of Provider Response 0950  Notify: Rapid Response  Name of Rapid Response RN Notified Not called per Dr. Grandville Silos  Document  Patient Outcome Stabilized after interventions  Progress note created (see row info) Yes  Assess: SIRS CRITERIA  SIRS Temperature  0  SIRS Pulse 1  SIRS Respirations  0  SIRS WBC 1  SIRS Score Sum  2  Aware of Platelet count of 25 & orders to hold Aspirin, CT of the head  ordered & Coreg increased for next dose.

## 2020-08-19 NOTE — Progress Notes (Signed)
PT Cancellation Note  Patient Details Name: Gregg Williams MRN: [id number] DOB: Sep 07, 1955   Cancelled Treatment:    Reason Eval/Treat Not Completed: Fatigue/lethargy limiting ability to participate Pt seen by OT this afternoon, reports he is too fatigued from that effort to consider working with PT at this time.  Will maintain on caseload and attempt to see as appropriate.  Apparently pending a second bone biopsy.  Kreg Shropshire, DPT 08/19/2020, 4:27 PM

## 2020-08-19 NOTE — Progress Notes (Signed)
Rapid Response Event Note   Reason for Call : SVT/hypotension   Initial Focused Assessment: On arrival to [facility], pt alert but disoriented to time. HR 150s. All other VSS. Pt denies chest pain.    Interventions: EKG performed, showing atrial flutter. Dr. Grandville Silos at bedside. 2 dose of Adenosine administered. Pt's HR decreased to 50s briefly and pt went back into aflutter HR in 150s. BP stable. Dr. Grandville Silos spoke with cardiology and ordered amiodarone bolus and gtt. Amio gtt started and pt transferred to RM 244 on PCU. Shortly after arriving on PCU, pt's HR remained in the 140s and pt became hypotensive. RR called again. Pt still alert and mostly oriented but fatigued. 2L [place]. Remains on amiodarone gtt.   Plan of Care: Pt transferred to stepdown unit.     Event Summary:   MD Notified: Dr. Grandville Silos Dr. Damita Dunnings Call Time: 1824/ 2023 Arrival Time: 1830/ 2025 End Time: 1956/ 2030  Trellis Paganini, RN

## 2020-08-19 NOTE — Evaluation (Signed)
Occupational Therapy Evaluation Patient Details Name: Gregg Williams MRN: [id number] DOB: 05-21-1955 Today's Date: 08/19/2020    History of Present Illness 65 y.o. male with medical history significant of HTN, HLD, pulmonary hypertension on 1.5 L oxygen at home, CAD, obesity, thrombocytopenia, chronic back pain, who presents with lower back pain and abnormal lab. Concern for myeloproliferative disease and BMB pending.  Stay complicated by atrial tachycardia and worsening dyspnea. S/p bone marrow biopsy 6/22, now pending 2nd biopsy.   Clinical Impression   Pt was seen for OT evaluation this date. Prior to hospital admission, pt reports he was independent, working as a Product manager. He denies falls but does report his daughter in law helps with groceries and cleaning. Pt reports living in a 1 story home with "10 or 20" steps to get inside. Pt alert and oriented to self and hospital requiring visual cues for hospital name. Unable to convey why he is here and what he had done (bone marrow biopsy). Pt able to follow commands fairly well, intermittent cues for safety. Currently pt demonstrates impairments as described below (See OT problem list) which functionally limit *his/her ability to perform ADL/self-care tasks. Pt currently requires MAX A for LB ADL, MIN-MOD A x2 for ADL transfers +RW, and MIN A for seated UB ADL Tasks. Pt performed transfer to [facility], urinated, and able to tolerate seated sponge bath and stood for ~71min while RN assessed back and replaced foam pads. Pt fatigued afterwards. HR up to 120's briefly with exertion, SpO2 91% on 2L. VC for PLB intermittently. Pt would benefit from skilled OT services to address noted impairments and functional limitations (see below for any additional details) in order to maximize safety and independence while minimizing falls risk and caregiver burden. Given significant change in functional ADL/mobility from baseline independence (per pt),  upon hospital discharge, recommend STR to maximize pt safety and return to PLOF.     Follow Up Recommendations  SNF    Equipment Recommendations  3 in 1 bedside commode    Recommendations for Other Services       Precautions / Restrictions Precautions Precautions: Fall Precaution Comments: s/p bone marrow biospy low back Restrictions Weight Bearing Restrictions: No      Mobility Bed Mobility Overal bed mobility: Needs Assistance Bed Mobility: Supine to Sit;Sit to Supine     Supine to sit: Min assist;Mod assist;HOB elevated Sit to supine: Mod assist;Min assist   General bed mobility comments: assist for BLE mgt and trunk elevation, use of bed rails, increased effort    Transfers Overall transfer level: Needs assistance Equipment used: Rolling walker (2 wheeled) Transfers: Sit to/from [facility] Sit to Stand: Mod assist;Min assist;+2 physical assistance;From elevated surface Stand pivot transfers: Min assist;Mod assist;+2 safety/equipment;From elevated surface       General transfer comment: less assist required once in standing to turn towards [facility] and to get up from [facility] back to bed    Balance Overall balance assessment: Needs assistance Sitting-balance support: No upper extremity supported;Single extremity supported;Feet supported Sitting balance-Leahy Scale: Fair     Standing balance support: Bilateral upper extremity supported Standing balance-Leahy Scale: Poor                             ADL either performed or assessed with clinical judgement   ADL Overall ADL's : Needs assistance/impaired  General ADL Comments: Pt performed transfer to [facility] + RW +2 min-mod assist, MAX A for LB ADL 2/2 weak, back pain, and body habitus, MIN A for UB ADL from seated position     Vision Baseline Vision/History: Wears glasses Wears Glasses: Reading only Patient Visual Report: No change from  baseline       Perception     Praxis      Pertinent Vitals/Pain Pain Assessment: 0-10 Pain Score: 6  Pain Location: low back, worse in standing Pain Descriptors / Indicators: Aching Pain Intervention(s): Limited activity within patient's tolerance;Monitored during session;Repositioned     Hand Dominance     Extremity/Trunk Assessment Upper Extremity Assessment Upper Extremity Assessment: Generalized weakness (grossly 4-/5 bilat)   Lower Extremity Assessment Lower Extremity Assessment: Generalized weakness       Communication Communication Communication: No difficulties   Cognition Arousal/Alertness: Awake/alert Behavior During Therapy: WFL for tasks assessed/performed Overall Cognitive Status: Within Functional Limits for tasks assessed                                 General Comments: Pt alert and oriented to self, place, reporting it's October '22 and unsure why he is here. Follows commands with ocassional cues   General Comments       Exercises Other Exercises Other Exercises: Bed mobility training, ADL transfer training + RW, toileting at [facility], seated bathing + gown change   Shoulder Instructions      Home Living Family/patient expects to be discharged to:: Private residence Living Arrangements: Alone Available Help at Discharge: Family;Available PRN/intermittently (pt reports daughter in law helps wiht cleaning and groceries) Type of Home: House Home Access: Stairs to enter [employer] of Steps: 10-20 Entrance Stairs-Rails: Right;Left Home Layout: One level     Bathroom Shower/Tub: Teacher, early years/pre: Standard     Home Equipment: None   Additional Comments: No family present, unable to verify pt's account of home/PLOF      Prior Functioning/Environment Level of Independence: Independent        Comments: Pt reports being independent at baseline, no AD, drives, and works as a Medical laboratory scientific officer; denies falls; reports daughter in Sports coach assists with groceries and cleaning        OT Problem List: Decreased strength;Pain;Decreased activity tolerance;Impaired balance (sitting and/or standing);Decreased knowledge of use of DME or AE;Obesity      OT Treatment/Interventions: Self-care/ADL training;Therapeutic exercise;Therapeutic activities;DME and/or AE instruction;Patient/family education;Energy conservation;Balance training    OT Goals(Current goals can be found in the care plan section) Acute Rehab OT Goals Patient Stated Goal: get stronger OT Goal Formulation: With patient Time For Goal Achievement: 09/02/20 Potential to Achieve Goals: Good ADL Goals Pt Will Perform Lower Body Dressing: with min guard assist;sit to/from stand;with set-up Pt Will Transfer to Toilet: with min guard assist;ambulating;bedside commode (LRAD for amb) Additional ADL Goal #1: Pt will perform seated sponge bath with AE PRN, set up and MIN A for LB bathing.  OT Frequency: Min 2X/week   Barriers to D/C: Inaccessible home environment;Decreased caregiver support          Co-evaluation              AM-PAC OT "6 Clicks" Daily Activity     Outcome Measure Help from another person eating meals?: None Help from another person taking care of personal grooming?: A Little Help from another person toileting, which includes using toliet, bedpan,  or urinal?: A Lot Help from another person bathing (including washing, rinsing, drying)?: A Lot Help from another person to put on and taking off regular upper body clothing?: A Little Help from another person to put on and taking off regular lower body clothing?: A Lot 6 Click Score: 16   End of Session Equipment Utilized During Treatment: Gait belt;Rolling walker;Oxygen  Activity Tolerance: Patient tolerated treatment well Patient left: in bed;with call bell/phone within reach;with bed alarm set  OT Visit Diagnosis: Other abnormalities of gait and  mobility (R26.89);Muscle weakness (generalized) (M62.81);Pain Pain - Right/Left:  (low back)                Time: 1590-1724 OT Time Calculation (min): 41 min Charges:  OT General Charges $OT Visit: 1 Visit OT Evaluation $OT Eval Moderate Complexity: 1 Mod OT Treatments $Self Care/Home Management : 23-37 mins  Hanley Hays, MPH, MS, OTR/L ascom [phone] 08/19/20, 4:08 PM

## 2020-08-19 NOTE — Progress Notes (Addendum)
Cross coverage PROGRESS NOTE    Gregg Williams  ZQW:[id number] DOB: September 24, 1955 DOA: 08/14/2020 PCP: Kaleen Mask, MD    Brief Narrative: Gregg Williams is an 65 y.o. male with medical history significant of HTN, HLD, pulmonary hypertension on 1.5 L oxygen at home, CAD, obesity, thrombocytopenia, chronic back pain, currently awaiting transfer to [facility] for work-up of myeloproliferative disorder with pancytopenia.  Hospital course complicated by narrow complex tachyarrhythmia requiring amiodarone bolus and infusion  Bedside evaluation requested after patient was noted to be hypotensive with systolic in the 70s.  Patient was evaluated at bedside.  He was chronically ill-appearing but alert and oriented x3.  Denied chest pain, palpitations or shortness of breath.  Denied lightheadedness.  Decision made to transfer patient to the stepdown unit for closer monitoring given risk of hemodynamic instability and possible need for electrical cardioversion.  Of note, patient was previously signed out by the attending who had speaking with cardiologist Dr. Mariah Milling who recommended that in the event patient became unstable that he should be electrically cardioverted.   Assessment & Plan:  Narrow complex tachyarrhythmia on amiodarone with mild hypotension but otherwise not unstable - Transferred to stepdown for closer BP monitoring. Last BP at 10:40pm 90/57 for MAP of 64 - IV fluid bolus with close BP monitoring -Cardiology paged and awaiting response at this time tp provide update (Per signout recommendations, Dr. Mariah Milling advised can repeat amiodarone bolus 150 mg.  Can electrically cardiovert if unstable,) - Continue amiodarone  - Plan of care discussed with patient's son and sister at the bedside and they are in agreement -Spoke with [facility] transfer center at around 10:40 PM as they called for an update on patient's condition  Continue management of other conditions  Addendum: At around 1:30 AM  spoke with cardiologist, Dr. Azucena Cecil given that patient's rate continued in the 140s.  He advised to give the second amiodarone bolus and continue to titrate the infusion per protocol and plan for TEE cardioversion in the a.m.  If patient decompensates prior to that then emergency electrical cardioversion to take place.   Also during the night spoke with MICU fellow at [facility], Dr. Valla Leaver who was updated  CRITICAL CARE Performed by: Andris Baumann   Total critical care time:120 minutes  Critical care time was exclusive of separately billable procedures and treating other patients.  Critical care was necessary to treat or prevent imminent or life-threatening deterioration.  Critical care was time spent personally by me on the following activities: development of treatment plan with patient and/or surrogate as well as nursing, discussions with consultants, evaluation of patient's response to treatment, examination of patient, obtaining history from patient or surrogate, ordering and performing treatments and interventions, ordering and review of laboratory studies, ordering and review of radiographic studies, pulse oximetry and re-evaluation of patient's condition.    Principal Problem:   Lower back pain Active Problems:   Diabetes mellitus without complication (HCC)   Thrombocytopenia (HCC)   Pulmonary hypertension (HCC)   Chronic respiratory failure with hypoxia (HCC)   Hypokalemia   CAD (coronary artery disease)   SIRS (systemic inflammatory response syndrome) (HCC)   Obesity, Class III, BMI 40-49.9 (morbid obesity) (HCC)   Pancytopenia (HCC)   Atrial tachycardia (HCC)   Leg edema   Acute metabolic encephalopathy   Myeloproliferative neoplasm (HCC)   AF (paroxysmal atrial fibrillation) (HCC)   Atrial flutter, paroxysmal (HCC)    Subjective: Patient awake and alert, though chronically ill-appearing  Objective: Vitals:  08/19/20 2021 08/19/20 2045 08/19/20 2100 08/19/20  2230  BP: (!) 70/50 (!) 84/50 (!) 78/52 (!) 88/57  Pulse: (!) 145 (!) 144 (!) 144 (!) 145  Resp: (!) 24 (!) 22 (!) 22 (!) 21  Temp:  99.7 F (37.6 C)    TempSrc:  Axillary    SpO2:  100% 100% 100%  Weight:  125.4 kg    Height:  $Remove'5\' 8"'LUtWcrC$  (1.727 m)      Intake/Output Summary (Last 24 hours) at 08/19/2020 2210 Last data filed at 08/19/2020 1811 Gross per 24 hour  Intake 240 ml  Output 1000 ml  Net -760 ml   Filed Weights   08/14/20 1008 08/19/20 2045  Weight: 123.4 kg 125.4 kg    Examination:  General exam: Appears calm and comfortable  Respiratory system: Clear to auscultation. Respiratory effort normal. Cardiovascular system: Tachycardic. Gastrointestinal system: Abdomen is nondistended, soft and nontender. No organomegaly or masses felt. Normal bowel sounds heard. Central nervous system: Alert and oriented. No focal neurological deficits. Psychiatry: Judgement and insight appear normal. Mood & affect appropriate.     Data Reviewed: I have personally reviewed following labs and imaging studies  CBC: Recent Labs  Lab 08/14/20 1023 08/15/20 0813 08/16/20 0937 08/17/20 0442 08/18/20 0454 08/19/20 0506  WBC 2.3* 2.0* 2.1* 2.3* 2.2* 1.7*  NEUTROABS 1.2*  --   --  1.2* 1.3* 0.7*  HGB 11.5* 11.0* 11.2* 10.9* 10.0* 9.1*  HCT 34.2* 32.4* 32.4* 31.7* 28.9* 26.3*  MCV 90.7 91.3 88.8 89.8 89.5 89.8  PLT 41* 39* 38* 40* 33* 25*   Basic Metabolic Panel: Recent Labs  Lab 08/14/20 1211 08/15/20 0813 08/16/20 0937 08/17/20 0442 08/18/20 0454 08/18/20 0813 08/19/20 0506 08/19/20 1922  NA  --    < > 135 134* 133*  --  133* 132*  K  --    < > 3.5 3.7 4.3  --  4.5 3.8  CL  --    < > 97* 95* 91*  --  90* 86*  CO2  --    < > $R'27 29 29  'ri$ --  35* 37*  GLUCOSE  --    < > 198* 166* 144*  --  146* 147*  BUN  --    < > $R'12 11 17  'nx$ --  27* 23  CREATININE  --    < > 0.55* 0.41* 0.56*  --  0.61 0.67  CALCIUM  --    < > 8.3* 8.4* 8.4*  --  8.4* 8.6*  MG 2.1  --   --   --   --  2.0  --   2.0   < > = values in this interval not displayed.   GFR: Estimated Creatinine Clearance: 123.5 mL/min (by C-G formula based on SCr of 0.67 mg/dL). Liver Function Tests: Recent Labs  Lab 08/14/20 1023  AST 23  ALT 13  ALKPHOS 89  BILITOT 1.0  PROT 6.8  ALBUMIN 2.4*   No results for input(s): LIPASE, AMYLASE in the last 168 hours. Recent Labs  Lab 08/18/20 0813 08/19/20 1013  AMMONIA 36* 47*   Coagulation Profile: Recent Labs  Lab 08/14/20 1211 08/15/20 1030  INR 1.0 1.1   Cardiac Enzymes: No results for input(s): CKTOTAL, CKMB, CKMBINDEX, TROPONINI in the last 168 hours. BNP (last 3 results) No results for input(s): PROBNP in the last 8760 hours. HbA1C: No results for input(s): HGBA1C in the last 72 hours. CBG: Recent Labs  Lab 08/19/20 0747 08/19/20 1143 08/19/20  [place] 08/19/20 2011 08/19/20 2045  GLUCAP 144* 185* 138* 160* 164*   Lipid Profile: No results for input(s): CHOL, HDL, LDLCALC, TRIG, CHOLHDL, LDLDIRECT in the last 72 hours. Thyroid Function Tests: Recent Labs    08/18/20 0454  TSH 2.498   Anemia Panel: No results for input(s): VITAMINB12, FOLATE, FERRITIN, TIBC, IRON, RETICCTPCT in the last 72 hours. Sepsis Labs: Recent Labs  Lab 08/14/20 1211 08/14/20 1248 08/14/20 1448 08/14/20 1750 08/15/20 0813 08/16/20 0937  PROCALCITON 0.38  --   --   --   --  0.34  LATICACIDVEN  --  2.6* 2.2* 2.1* 1.9  --     Recent Results (from the past 240 hour(s))  Culture, blood (routine x 2)     Status: None   Collection Time: 08/14/20  2:19 PM   Specimen: BLOOD  Result Value Ref Range Status   Specimen Description BLOOD RIGHT ANTECUBITAL  Final   Special Requests   Final    BOTTLES DRAWN AEROBIC AND ANAEROBIC Blood Culture adequate volume   Culture   Final    NO GROWTH 5 DAYS Performed at [facility], [address], [place], [place] [address]    Report Status 08/19/2020 FINAL  Final  Culture, blood (routine x 2)     Status: None    Collection Time: 08/14/20  2:25 PM   Specimen: BLOOD  Result Value Ref Range Status   Specimen Description BLOOD RIGHT HAND  Final   Special Requests   Final    BOTTLES DRAWN AEROBIC AND ANAEROBIC Blood Culture adequate volume   Culture   Final    NO GROWTH 5 DAYS Performed at [facility], [address] [place], [place] [address]    Report Status 08/19/2020 FINAL  Final  SARS CORONAVIRUS 2 (TAT 6-24 HRS) Nasopharyngeal Nasopharyngeal Swab     Status: None   Collection Time: 08/14/20  3:46 PM   Specimen: Nasopharyngeal Swab  Result Value Ref Range Status   SARS Coronavirus 2 NEGATIVE NEGATIVE Final    Comment: (NOTE) SARS-CoV-2 target nucleic acids are NOT DETECTED.  The SARS-CoV-2 RNA is generally detectable in upper and lower respiratory specimens during the acute phase of infection. Negative results do not preclude SARS-CoV-2 infection, do not rule out co-infections with other pathogens, and should not be used as the sole basis for treatment or other patient management decisions. Negative results must be combined with clinical observations, patient history, and epidemiological information. The expected result is Negative.  Fact Sheet for Patients: SugarRoll.be  Fact Sheet for Healthcare Providers: https://www.woods-mathews.com/  This test is not yet approved or cleared by the [place] FDA and  has been authorized for detection and/or diagnosis of SARS-CoV-2 by FDA under an Emergency Use Authorization (EUA). This EUA will remain  in effect (meaning this test can be used) for the duration of the COVID-19 declaration under Se ction 564(b)(1) of the Act, 21 U.S.C. section 360bbb-3(b)(1), unless the authorization is terminated or revoked sooner.  Performed at [facility], [place] [address], [place], [place] [address]          Radiology Studies: CT HEAD WO CONTRAST  Result Date: 08/19/2020 CLINICAL DATA:   Delirium. EXAM: CT HEAD WITHOUT CONTRAST TECHNIQUE: Contiguous axial images were obtained from the base of the skull through the vertex without intravenous contrast. COMPARISON:  No pertinent prior exams available for comparison. FINDINGS: Brain: Cerebral volume is normal for age. There is no acute intracranial hemorrhage. No demarcated cortical infarct.  No extra-axial fluid collection. No evidence of an intracranial mass. No midline shift. Vascular: No hyperdense vessel.  Atherosclerotic calcifications. Skull: Normal. Negative for fracture or focal lesion. Sinuses/Orbits: Visualized orbits show no acute finding. Mild left sphenoid sinus mucosal thickening. Other: Trace fluid within the right mastoid air cells. IMPRESSION: No evidence of acute intracranial abnormality. Mild left sphenoid sinus mucosal thickening. Trace right mastoid effusion. 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 ANESTHESIA/SEDATION: Moderate (conscious) sedation was employed during this procedure. A total of Versed 0 mg and Fentanyl 25 mcg was administered intravenously. Moderate Sedation Time: None minutes. The patient's level of consciousness and vital signs were monitored continuously by radiology nursing throughout the procedure under my direct supervision. FLUOROSCOPY TIME:  CT COMPLICATIONS: None PROCEDURE: Informed written consent was obtained from the patient after a thorough discussion of the procedural risks, benefits and alternatives. All questions were addressed. Maximal Sterile Barrier Technique was utilized including caps, mask, sterile gowns, sterile gloves, sterile drape, hand hygiene and skin antiseptic. A timeout was performed prior to the initiation of the procedure. The procedure risks,  benefits, and alternatives were explained to the patient. Questions regarding the procedure were encouraged and answered. The patient understands and consents to the procedure. Scout CT of the pelvis was performed for surgical planning purposes. The right posterior pelvis was prepped with Chlorhexidine in a sterile fashion, and a sterile drape was applied covering the operative field. A sterile gown and sterile gloves were used for the procedure. Local anesthesia was provided with 1% Lidocaine. Posterior iliac bone was targeted for biopsy. The skin and subcutaneous tissues were infiltrated with 1% lidocaine without epinephrine. A small stab incision was made with an 11 blade scalpel, and an 11 gauge Murphy needle was advanced with CT guidance to the posterior cortex. Manual forced was used to advance the needle through the posterior cortex and the stylet was removed. A bone marrow aspirate was retrieved and passed to a cytotechnologist in the room. The Murphy needle was then advanced without the stylet for a core biopsy. The core biopsy was retrieved and also passed to a cytotechnologist. Manual pressure was used for hemostasis and a sterile dressing was placed. No complications were encountered no significant blood loss was encountered. Patient tolerated the procedure well and remained hemodynamically stable throughout. IMPRESSION: Status post CT-guided bone marrow biopsy, with tissue specimen sent to pathology for complete histopathologic analysis Signed, Dulcy Fanny. Earleen Newport, DO [facility] Electronically Signed   By: Corrie Mckusick D.O.   On: 08/18/2020 11:46        Scheduled Meds:  aspirin EC  81 mg Oral Daily   carvedilol  25 mg Oral BID WC   Chlorhexidine Gluconate Cloth  6 each Topical Daily   cyanocobalamin  1,000 mcg Intramuscular Daily   docusate sodium  100 mg Oral Daily   furosemide  40 mg Intravenous BID   insulin aspart  0-5 Units  Subcutaneous QHS   insulin aspart  0-9 Units Subcutaneous TID WC   lactulose  20 g Oral BID   lidocaine  1 patch Transdermal Q24H   mometasone-formoterol  2 puff Inhalation BID   rifaximin  550 mg Oral BID   spironolactone  25 mg Oral Daily   Continuous Infusions:  amiodarone 60 mg/hr (08/19/20 1944)   Followed by   Derrill Memo ON 08/20/2020] amiodarone       LOS: 4 days    Time spent: [place] min    Athena Masse, MD [facility]

## 2020-08-19 NOTE — Progress Notes (Signed)
   08/19/20 1900  Assess: MEWS Score  BP 121/68  Pulse Rate (!) 147  SpO2 100 %  Assess: MEWS Score  MEWS Temp 0  MEWS Systolic 0  MEWS Pulse 3  MEWS RR 0  MEWS LOC 0  MEWS Score 3  MEWS Score Color Yellow  Assess: if the MEWS score is Yellow or Red  Were vital signs taken at a resting state? Yes  Focused Assessment Change from prior assessment (see assessment flowsheet)  Does the patient meet 2 or more of the SIRS criteria? Yes  Does the patient have a confirmed or suspected source of infection? No  MEWS guidelines implemented *See Row Information* Yes  Treat  MEWS Interventions Escalated (See documentation below)  Take Vital Signs  Increase Vital Sign Frequency  Yellow: Q 2hr X 2 then Q 4hr X 2, if remains yellow, continue Q 4hrs  Escalate  MEWS: Escalate Yellow: discuss with charge nurse/RN and consider discussing with provider and RRT  Notify: Charge Nurse/RN  Name of Charge Nurse/RN Notified Rochel Brome, RN  Date Charge Nurse/RN Notified 08/19/20  Time Charge Nurse/RN Notified 1840 (In room during rapid response)  Notify: Provider  Provider Name/Title Dr. Grandville Silos & Dr. Rockey Situ (Spoke with Dr. Rockey Situ on the phone & MD spoke with Dr. Grandville Silos)  Date Provider Notified 08/19/20  Time Provider Notified [place]  Notification Type Page  Notification Reason Change in status  Provider response At bedside  Date of Provider Response 08/19/20  Time of Provider Response 1840  Document  Patient Outcome Transferred/level of care increased  Progress note created (see row info) Yes  Assess: SIRS CRITERIA  SIRS Temperature  0  SIRS Pulse 1  SIRS Respirations  0  SIRS WBC 0  SIRS Score Sum  1

## 2020-08-19 NOTE — Progress Notes (Signed)
   08/19/20 2012  Assess: MEWS Score  Temp 98.2 F (36.8 C)  BP (!) 82/58  Pulse Rate (!) 144  ECG Heart Rate (!) 145  Resp 20  Level of Consciousness Alert  SpO2 98 %  O2 Device Nasal Cannula  O2 Flow Rate (L/min) 2 L/min  Assess: MEWS Score  MEWS Temp 0  MEWS Systolic 1  MEWS Pulse 3  MEWS RR 0  MEWS LOC 0  MEWS Score 4  MEWS Score Color Red  Assess: if the MEWS score is Yellow or Red  Were vital signs taken at a resting state? Yes  Focused Assessment Change from prior assessment (see assessment flowsheet)  Does the patient meet 2 or more of the SIRS criteria? No  Does the patient have a confirmed or suspected source of infection? No  Early Detection of Sepsis Score *See Row Information* High  MEWS guidelines implemented *See Row Information* Yes  Treat  MEWS Interventions Escalated (See documentation below)  Pain Scale 0-10  Pain Score 0  Take Vital Signs  Increase Vital Sign Frequency  Red: Q 1hr X 4 then Q 4hr X 4, if remains red, continue Q 4hrs  Escalate  MEWS: Escalate Red: discuss with charge nurse/RN and provider, consider discussing with RRT  Notify: Charge Nurse/RN  Name of Charge Nurse/RN Notified Carin Hock  Date Charge Nurse/RN Notified 08/19/20  Time Charge Nurse/RN Notified 2012  Notify: Provider  Provider Name/Title Judd Gaudier MD  Date Provider Notified 08/19/20  Time Provider Notified 2015  Notification Type Page  Notification Reason Change in status  Provider response At bedside  Date of Provider Response 08/19/20  Time of Provider Response 2020  Notify: Rapid Response  Name of Rapid Response RN Notified Ann RN  Date Rapid Response Notified 08/19/20  Time Rapid Response Notified 2025  Document  Patient Outcome Transferred/level of care increased  Progress note created (see row info) Yes  Assess: SIRS CRITERIA  SIRS Temperature  0  SIRS Pulse 1  SIRS Respirations  0  SIRS WBC 0  SIRS Score Sum  1     RRT called, Dr. Damita Dunnings, RT  and [facility] Nikki at bedside. Ordered to transfer pt to stepdown per MD Damita Dunnings. Report given to [facility] ICU RN, pt transferred with all belongings to ICU bed 17.

## 2020-08-19 NOTE — Progress Notes (Signed)
Mobility Specialist - Progress Note   08/19/20 1700  Mobility  Activity Transferred to/from [facility]  Level of Assistance Minimal assist, patient does 75% or more  Assistive Device Front wheel walker  Distance Ambulated (ft) 2 ft  Mobility Out of bed for toileting  Mobility Response Tolerated well  Mobility performed by Mobility specialist  $Mobility charge 1 Mobility    Pt returned to bed from [facility], no success with BM. NT in to assist.    Kathee Delton Mobility Specialist 08/19/20, 5:05 PM

## 2020-08-19 NOTE — Progress Notes (Signed)
   08/19/20 1022  Notify: Provider  Provider Name/Title Beckey Rutter, NP  Date Provider Notified 08/19/20  Time Provider Notified 2081  Notification Type Page  Notification Reason Change in status;Critical result (Platelet count 25 Aware)  Provider response No new orders  Date of Provider Response 08/19/20  Time of Provider Response 1023

## 2020-08-20 ENCOUNTER — Other Ambulatory Visit: Payer: Self-pay

## 2020-08-20 ENCOUNTER — Inpatient Hospital Stay: Payer: Commercial Managed Care - PPO | Admitting: Certified Registered"

## 2020-08-20 ENCOUNTER — Inpatient Hospital Stay: Payer: Commercial Managed Care - PPO

## 2020-08-20 ENCOUNTER — Encounter
Admission: EM | Disposition: A | Discharge status: Other Acute Inpatient Hospital | Payer: Self-pay | Source: Home / Self Care | Attending: Internal Medicine

## 2020-08-20 ENCOUNTER — Encounter: Payer: Self-pay | Admitting: Internal Medicine

## 2020-08-20 DIAGNOSIS — I5030 Unspecified diastolic (congestive) heart failure: Secondary | ICD-10-CM

## 2020-08-20 DIAGNOSIS — K7682 Hepatic encephalopathy: Secondary | ICD-10-CM

## 2020-08-20 DIAGNOSIS — K7200 Acute and subacute hepatic failure without coma: Secondary | ICD-10-CM

## 2020-08-20 DIAGNOSIS — I4892 Unspecified atrial flutter: Secondary | ICD-10-CM

## 2020-08-20 HISTORY — PX: CARDIOVERSION: SHX1299

## 2020-08-20 LAB — GLUCOSE, CAPILLARY
Glucose-Capillary: 149 mg/dL — ABNORMAL HIGH (ref 70–99)
Glucose-Capillary: 186 mg/dL — ABNORMAL HIGH (ref 70–99)
Glucose-Capillary: 161 mg/dL — ABNORMAL HIGH (ref 70–99)

## 2020-08-20 LAB — BASIC METABOLIC PANEL
Anion gap: 10 (ref 5–15)
BUN: 21 mg/dL (ref 8–23)
CO2: 35 mmol/L — ABNORMAL HIGH (ref 22–32)
Calcium: 8.3 mg/dL — ABNORMAL LOW (ref 8.9–10.3)
Chloride: 88 mmol/L — ABNORMAL LOW (ref 98–111)
Creatinine, Ser: 0.53 mg/dL — ABNORMAL LOW (ref 0.61–1.24)
GFR, Estimated: >60 mL/min (ref >60)
Glucose, Bld: 169 mg/dL — ABNORMAL HIGH (ref 70–99)
Potassium: 4 mmol/L (ref 3.5–5.1)
Sodium: 133 mmol/L — ABNORMAL LOW (ref 135–145)

## 2020-08-20 LAB — CBC WITH DIFFERENTIAL/PLATELET
Abs Immature Granulocytes: 0.01 10*3/uL (ref 0.00–0.07)
Basophils Absolute: 0 10*3/uL (ref 0.0–0.1)
Basophils Relative: 1 %
Eosinophils Absolute: 0 10*3/uL (ref 0.0–0.5)
Eosinophils Relative: 0 %
HCT: 27.3 % — ABNORMAL LOW (ref 39.0–52.0)
Hemoglobin: 9.5 g/dL — ABNORMAL LOW (ref 13.0–17.0)
Immature Granulocytes: 1 %
Lymphocytes Relative: 41 %
Lymphs Abs: 0.5 10*3/uL — ABNORMAL LOW (ref 0.7–4.0)
MCH: 31.3 pg (ref 26.0–34.0)
MCHC: 34.8 g/dL (ref 30.0–36.0)
MCV: 89.8 fL (ref 80.0–100.0)
Monocytes Absolute: 0.1 10*3/uL (ref 0.1–1.0)
Monocytes Relative: 5 %
Neutro Abs: 0.7 10*3/uL — ABNORMAL LOW (ref 1.7–7.7)
Neutrophils Relative %: 52 %
Platelets: 26 10*3/uL — CL (ref 150–400)
RBC: 3.04 MIL/uL — ABNORMAL LOW (ref 4.22–5.81)
RDW: 14.3 % (ref 11.5–15.5)
Smear Review: NORMAL
WBC: 1.3 10*3/uL — CL (ref 4.0–10.5)
nRBC: 4.5 % — ABNORMAL HIGH (ref 0.0–0.2)

## 2020-08-20 LAB — AMMONIA: Ammonia: 30 umol/L (ref 9–35)

## 2020-08-20 LAB — SURGICAL PATHOLOGY

## 2020-08-20 SURGERY — CARDIOVERSION
Anesthesia: General

## 2020-08-20 MED ORDER — AMIODARONE IV BOLUS ONLY 150 MG/100ML
150.0000 mg | Freq: Once | INTRAVENOUS | Status: AC
  Administered 2020-08-20: 150 mg via INTRAVENOUS
  Filled 2020-08-20: qty 100

## 2020-08-20 MED ORDER — PANTOPRAZOLE SODIUM 40 MG IV SOLR: 40.0000 mg | INTRAVENOUS | Status: AC

## 2020-08-20 MED ORDER — LIDOCAINE 5 % EX PTCH: 1.0000 | MEDICATED_PATCH | CUTANEOUS | 0 refills | Status: AC

## 2020-08-20 MED ORDER — PHENYLEPHRINE HCL (PRESSORS) 10 MG/ML IV SOLN
INTRAVENOUS | Status: DC | PRN
  Administered 2020-08-20: 100 ug via INTRAVENOUS

## 2020-08-20 MED ORDER — FUROSEMIDE 10 MG/ML IJ SOLN: 40.0000 mg | Freq: Two times a day (BID) | INTRAMUSCULAR | 0 refills | Status: AC

## 2020-08-20 MED ORDER — SODIUM CHLORIDE 0.9 % IV BOLUS
500.0000 mL | Freq: Once | INTRAVENOUS | Status: AC
  Administered 2020-08-20: 500 mL via INTRAVENOUS

## 2020-08-20 MED ORDER — CYANOCOBALAMIN 1000 MCG/ML IJ SOLN: 1000.0000 ug | Freq: Every day | INTRAMUSCULAR | 0 refills | Status: AC

## 2020-08-20 MED ORDER — METHOCARBAMOL 500 MG PO TABS: 500.0000 mg | ORAL_TABLET | Freq: Three times a day (TID) | ORAL | 0 refills | Status: AC | PRN

## 2020-08-20 MED ORDER — PROPOFOL 10 MG/ML IV BOLUS
INTRAVENOUS | Status: DC | PRN
  Administered 2020-08-20: 80 mg via INTRAVENOUS

## 2020-08-20 MED ORDER — PANTOPRAZOLE SODIUM 40 MG IV SOLR
40.0000 mg | INTRAVENOUS | Status: DC
  Administered 2020-08-20: 40 mg via INTRAVENOUS
  Filled 2020-08-20: qty 40

## 2020-08-20 MED ORDER — SPIRONOLACTONE 25 MG PO TABS: 25.0000 mg | ORAL_TABLET | Freq: Every day | ORAL | Status: DC

## 2020-08-20 MED ORDER — ACETAMINOPHEN 325 MG PO TABS: 650.0000 mg | ORAL_TABLET | Freq: Four times a day (QID) | ORAL | Status: AC | PRN

## 2020-08-20 MED ORDER — LEVALBUTEROL HCL 1.25 MG/0.5ML IN NEBU: 1.2500 mg | INHALATION_SOLUTION | Freq: Four times a day (QID) | RESPIRATORY_TRACT | 12 refills | Status: AC | PRN

## 2020-08-20 NOTE — Discharge Summary (Signed)
Physician Discharge Summary  Gregg Williams FTD:[id number] DOB: 14-Jun-1955 DOA: 08/14/2020  PCP: Leonard Downing, MD  Admit date: 08/14/2020 Discharge date: 08/20/2020  Time spent: 60 minutes  Recommendations for Outpatient Follow-up:  Discharge to [facility].   Discharge Diagnoses:  Principal Problem:   Lower back pain Active Problems:   Diabetes mellitus without complication (HCC)   Thrombocytopenia (HCC)   Pulmonary hypertension (HCC)   Chronic respiratory failure with hypoxia (HCC)   Hypokalemia   CAD (coronary artery disease)   SIRS (systemic inflammatory response syndrome) (HCC)   Obesity, Class III, BMI 40-49.9 (morbid obesity) (HCC)   Pancytopenia (HCC)   Atrial tachycardia (HCC)   Leg edema   Acute metabolic encephalopathy   Myeloproliferative neoplasm (HCC)   AF (paroxysmal atrial fibrillation) (HCC)   Atrial flutter, paroxysmal ([place])   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 Baruch Gouty is a 65 y.o. male with medical history significant of HTN, HLD, pulmonary hypertension on 1.5 L oxygen at home, CAD, obesity, thrombocytopenia, chronic back pain, who presented with lower back pain and abnormal lab.   Patient stated he has chronic low back pain for more than 6 years.  He had back surgery in the past, in the past several weeks, his back pain has been progressively worsening.  The back pain is located in the lower back, constant, 7 out of 10 in severity, sharp, radiating to the right leg laterally.  Patient also has mild weakness in right leg. No urinary incontinence or loss control of bowel movement.  Patient denies chest pain, cough, shortness breath.  No fever or chills. Denied nausea vomiting, diarrhea or  abdominal.  No symptoms of UTI. Pt was seen by his doctor and had x-rays and some lab tests done a few days ago. Pt was called yesterday by his doctor and informed that he had several abnormal labs on a recent visit.  Specifically, he had alk phos in the 140s, CRP of 30, and sed rate of 130.  He was told to come to hospital for further evaluation and treatment due to concerns of possible infection   ED Course: pt was found to have WBC 2.3, platelet 41 (104 on 07/17/2020), pending COVID PCR, troponin level 7, lactic acid 2.6, negative urinalysis, potassium 3.4, renal function okay, temperature normal, blood pressure 147/124, heart rate 88-92 in ED, RR 20, oxygen saturation 88% on room air, which improved to 93-95% on home 1.5 L oxygen.  Chest x-ray is negative for infiltration, but showed chronic right pleural thickening.  Patient is placed on MedSurg bed for position    Hospital Course:  1 lower back pain -Patient noted to have chronic low back pain for greater than 6 years status post spinal surgery. -Patient noted to have had progressive worsening back pain for the past several weeks with radiation of back pain to the right leg laterally with also some complaints of mild right leg weakness. -No bladder or bowel incontinence. -It is noted that patient had some elevated markers at PCPs office with a CRP of 30, sed rate of 130, alk phosphatase of 140s. -MRI L-spine negative for spinal cord compression, showed concerns for myeloproliferative disorder, also noted was right sided disc protrusion L5-S1 with mild  progression since 2020, flattening and displacement of right S1 nerve root.  Multilevel degenerative change throughout the lumbar spine otherwise similar to prior MRI. -Patient currently denies any significant pain. -Continue current pain regimen, with Percocet, continue Lidoderm patch.   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    -Will need outpatient follow-up with primary neurosurgeon.  2.  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 -Patient with no overt bleeding. -Hematology consulted and patient status post bone marrow biopsy 08/18/2020.   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 -Counts trending down and white count currently at 1.3, hemoglobin 9.5, platelet count 26 -Transfusion threshold hemoglobin < 7, platelet count < 10 or  > 20 with active bleeding. -DC aspirin. 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   -Patient will be transferred to [facility] as patient has been successfully cardioverted.   3.  Acute metabolic encephalopathy/?Hepatic encephalopathy -Per RN patient with confusion over the past few days which slowly improved.   -No asterixis is noted on examination.   -Patient noted to have an elevated ammonia level, abdominal ultrasound with findings concerning for possible cirrhosis.  -Head CT done unremarkable. -Patient with no focal neurological deficits. -Patient placed on lactulose twice daily as well as Xifaxan.   -Narcotic pain medication dose reduced.   -Patient with clinical improvement   -Continue lactulose, Xifaxan.   -Supportive care.    4.  Atrial flutter/paroxysmal A. fib/paroxysmal atrial tachycaria/??  SVT -Patient seen in consultation by cardiology.  Patient noted to have some PACs early on in the hospitalization and on 6/23 noted to have some runs of atrial fibrillation in the morning which improved transiently with increased dose of Coreg and IV Lopressor. -On the evening of 08/19/2020 , patient noted to be in persistent atrial flutter and initial concerns  for SVT. -Patient underwent carotid massage, adenosine x2 initially due to concerns for SVT. -EKG done consistent with atrial flutter. -Patient placed on amiodarone drip and received amiodarone bolus 150 mg x 2 however heart rate persistent in the high 140s. -Cardiology following and patient underwent successful cardioversion today 08/20/2020.   -Patient maintained on IV amiodarone drip which will be continued on discharge to [facility] discontinued.   -Patient seen and followed by cardiology throughout the hospitalization.    5.  Acute on chronic diastolic CHF exacerbation/pulmonary hypertension -Patient assessed by cardiology and is noted that patient has some crackles on examination with lower extremity edema on 08/17/2020. -Urine output 1.325 L over the past 24 hours.  -Patient maintained on IV Lasix which was subsequently held on day of discharge due to systolic blood pressures in the 90s.  -Patient also placed on Aldactone which was held secondary to soft blood pressure.   -Patient initially placed on Coreg however had to be placed on amiodarone drip due to persistent atrial flutter and as such Coreg discontinued.   -Patient being transferred to [facility] and will be transferred on amiodarone drip.   -Patient was seen and followed by cardiology throughout the hospitalization.    6.  Possible cirrhosis per abdominal ultrasound -Abdominal ultrasound with hepatomegaly and concerns for possible cirrhosis. -Patient denied any history of cirrhosis. -Patient was placed on IV Lasix as  well as Aldactone due to initial concerns for CHF exacerbation.   -IV Lasix discontinued due to soft blood pressures with systolics in the 10X.   -Patient maintained on lactulose and Xifaxan due to concerns for hepatic encephalopathy.   -Will need outpatient follow-up with GI.   7.  Lower extremity edema -Questionable etiology differential included possible cirrhosis, versus CHF,  versus morbid obesity. -Improved with diuresis. -Urine output 1.325 L over the past 24 hours.  -DC IV Lasix due to systolic blood pressures in the 90s.  -Aldactone held due to soft blood pressures.   -Neurology following the patient throughout the hospitalization.  -Patient being transferred to [facility].    8.  Diabetes mellitus type 2 without complication -Hemoglobin A1c 7.1. -Patient noted to be on Jardiance and Amaryl. -Jardiance and Amaryl were held during the hospitalization.   -Patient maintained on sliding scale insulin.   -Outpatient follow-up with PCP.    9.  Hypokalemia -Repleted. -Potassium at 4.0.   10.  Hyperlipidemia -Patient not on a statin. -Outpatient follow-up with PCP.  11.  Coronary artery disease -Stable. -Patient was placed on amiodarone drip secondary to persistent a flutter. -Patient also noted to have been started on Aldactone during the hospitalization. -Patient was on IV Lasix however this was discontinued due to systolic blood pressures in the 90s. -Patient's aspirin was also discontinued secondary to thrombocytopenia.  12.  Morbid obesity  13.  Chronic respiratory failure -On 1.5 to 2 L nasal cannula chronically. -Outpatient follow-up.    Procedures: Chest x-ray 08/14/2020 MRI L-spine 08/14/2020 Abdominal ultrasound 08/15/2020 CT bone marrow biopsy and aspiration 08/18/2020 per Dr. Earleen Newport, IR Cardioversion 08/20/2020-Per Dr. Saunders Revel CT head 08/19/2020   Consultations: Cardiology: Dr. Saunders Revel 08/17/2020 Interventional radiology: Dr. Anselm Pancoast 08/16/2020 Hematology/oncology: Dr. Rogue Bussing 08/16/2020 PCCM    Discharge Exam: Vitals:   08/20/20 0845 08/20/20 0850  BP:    Pulse: 74 86  Resp: 19   Temp:    SpO2: 100%     General: NAD.  Some ecchymosis noted on upper extremities. Cardiovascular: Irregularly irregular Respiratory: CT ABD anterior lung fields.  No wheezes, no crackles, no rhonchi.  Discharge  Instructions   Discharge Instructions     Diet - low sodium heart healthy   Complete by: As directed    Discharge wound care:   Complete by: As directed    As above   Increase activity slowly   Complete by: As directed       Allergies as of 08/20/2020       Reactions   Benadryl [diphenhydramine] Hives   Metformin    Diarrhea, stomach cramps        Medication List     STOP taking these medications    albuterol 108 (90 Base) MCG/ACT inhaler Commonly known as: VENTOLIN HFA   ALKA-SELTZER EXTRA STRENGTH PO   aspirin 81 MG EC tablet   carvedilol 6.25 MG tablet Commonly known as: COREG   glimepiride 4 MG tablet Commonly known as: AMARYL   indomethacin 25 MG capsule Commonly known as: INDOCIN   losartan 100 MG tablet Commonly known as: COZAAR   meloxicam 15 MG tablet Commonly known as: MOBIC   rosuvastatin 20 MG tablet Commonly known as: CRESTOR       TAKE these medications    acetaminophen 325 MG tablet Commonly known as: TYLENOL Take 2 tablets (650 mg total) by mouth every 6 (six) hours as needed for mild pain or fever.   Advair HFA 45-21 MCG/ACT  inhaler Generic drug: fluticasone-salmeterol Inhale 2 puffs into the lungs 2 (two) times daily.   amiodarone 360-4.14 MG/200ML-% Soln Commonly known as: NEXTERONE PREMIX Inject 30 mg/hr into the vein continuous.   cyanocobalamin 1000 MCG/ML injection Commonly known as: (VITAMIN B-12) Inject 1 mL (1,000 mcg total) into the muscle daily.   furosemide 10 MG/ML injection Commonly known as: LASIX Inject 4 mLs (40 mg total) into the vein 2 (two) times daily.   Jardiance 10 MG Tabs tablet Generic drug: empagliflozin Take 10 mg by mouth every morning.   lactulose 10 GM/15ML solution Commonly known as: CHRONULAC Take 30 mLs (20 g total) by mouth 2 (two) times daily.   levalbuterol 1.25 MG/0.5ML nebulizer solution Commonly known as: XOPENEX Take 1.25 mg by nebulization every 6 (six) hours as needed  for wheezing or shortness of breath.   lidocaine 5 % Commonly known as: LIDODERM Place 1 patch onto the skin daily. Remove & Discard patch within 12 hours or as directed by MD   methocarbamol 500 MG tablet Commonly known as: Robaxin Take 1 tablet (500 mg total) by mouth every 8 (eight) hours as needed for muscle spasms. What changed:  when to take this reasons to take this   oxyCODONE-acetaminophen 10-325 MG tablet Commonly known as: PERCOCET Take 1 tablet by mouth every 5 (five) hours as needed.   pantoprazole 40 MG injection Commonly known as: PROTONIX Inject 40 mg into the vein daily.   rifaximin 550 MG Tabs tablet Commonly known as: XIFAXAN Take 1 tablet (550 mg total) by mouth 2 (two) times daily.   spironolactone 25 MG tablet Commonly known as: ALDACTONE Take 1 tablet (25 mg total) by mouth daily.   tetrahydrozoline-zinc 0.05-0.25 % ophthalmic solution Commonly known as: VISINE-AC Place 2 drops into both eyes 3 (three) times daily as needed.   traZODone 50 MG tablet Commonly known as: DESYREL Take 1 tablet (50 mg total) by mouth at bedtime as needed for sleep.               Discharge Care Instructions  (From admission, onward)           Start     Ordered   08/20/20 0000  Discharge wound care:       Comments: As above   08/20/20 0929           Allergies  Allergen Reactions   Benadryl [Diphenhydramine] Hives   Metformin     Diarrhea, stomach cramps    Follow-up Information     MD at [facility] Follow up.                   The results of significant diagnostics from this hospitalization (including imaging, microbiology, ancillary and laboratory) are listed below for reference.    Significant Diagnostic Studies: DG Chest 2 View  Result Date: 08/14/2020 CLINICAL DATA:  Hypoxia EXAM: CHEST - 2 VIEW COMPARISON:  07/16/2020 FINDINGS: Heart size upper normal. Negative for heart failure. Diffuse pleural thickening on the right is  chronic and unchanged. Calcified pleural plaque on the right. Negative for infiltrate or effusion. IMPRESSION: Chronic right pleural thickening unchanged. No superimposed acute abnormality. Electronically Signed   By: Franchot Gallo M.D.   On: 08/14/2020 11:05   CT HEAD WO CONTRAST  Result Date: 08/19/2020 CLINICAL DATA:  Delirium. EXAM: CT HEAD WITHOUT CONTRAST TECHNIQUE: Contiguous axial images were obtained from the base of the skull through the vertex without intravenous contrast. COMPARISON:  No pertinent prior exams  available for comparison. FINDINGS: Brain: Cerebral volume is normal for age. There is no acute intracranial hemorrhage. No demarcated cortical infarct. No extra-axial fluid collection. No evidence of an intracranial mass. No midline shift. Vascular: No hyperdense vessel.  Atherosclerotic calcifications. Skull: Normal. Negative for fracture or focal lesion. Sinuses/Orbits: Visualized orbits show no acute finding. Mild left sphenoid sinus mucosal thickening. Other: Trace fluid within the right mastoid air cells. IMPRESSION: No evidence of acute intracranial abnormality. Mild left sphenoid sinus mucosal thickening. Trace right mastoid effusion. Electronically Signed   By: Kellie Simmering DO   On: 08/19/2020 12:57   MR LUMBAR SPINE WO CONTRAST  Result Date: 08/14/2020 CLINICAL DATA:  Right low back pain and right hip pain. Anemia and thrombocytopenia. EXAM: MRI LUMBAR SPINE WITHOUT CONTRAST TECHNIQUE: Multiplanar, multisequence MR imaging of the lumbar spine was performed. No intravenous contrast was administered. COMPARISON:  MRI lumbar spine 11/15/2018 FINDINGS: Segmentation:  Normal Alignment:  Normal Vertebrae:  Negative for fracture. Bone marrow is diffusely abnormal and very low signal on T1 and T2. This has developed since 2020. No focal lesion identified in the bone marrow. Conus medullaris and cauda equina: Conus extends to the L1-2 level. Conus and cauda equina appear normal. Paraspinal  and other soft tissues: Negative for paraspinous mass or adenopathy Disc levels: L1-2: Mild degenerative change.  Negative for stenosis L2-3: Disc bulging and bilateral facet hypertrophy. Mild to moderate spinal stenosis. Mild subarticular stenosis bilaterally. No interval change. L3-4: Disc degeneration with diffuse disc bulging and endplate spurring. This is asymmetric to the right and similar to the prior study. Bilateral facet degeneration. Mild spinal stenosis. Moderate subarticular stenosis bilaterally unchanged. L4-5: Disc degeneration with disc bulging and diffuse endplate spurring. Small annular fissure on the right with small disc protrusion unchanged from the prior study. Bilateral facet degeneration. Mild subarticular stenosis bilaterally. L5-S1: Right-sided disc protrusion has progressed with right S1 nerve root impingement IMPRESSION: Diffusely abnormal bone marrow which has developed since 2020. Possible myeloproliferative disorder. No fracture Right-sided disc protrusion L5-S1 with mild progression since 2020. There is flattening and displacement of the right S1 nerve root. Multilevel degenerative change throughout the lumbar spine otherwise similar to the prior MRI. Electronically Signed   By: Franchot Gallo M.D.   On: 08/14/2020 17:39   US Abdomen Complete  Result Date: 08/15/2020 CLINICAL DATA:  Hepatomegaly EXAM: ABDOMEN ULTRASOUND COMPLETE COMPARISON:  CT 11/16/2011 FINDINGS: Gallbladder: Diffuse gallbladder wall thickening measuring approximately 6.5 mm. No gallstones or pericholecystic fluid visualized. No sonographic Murphy sign noted by sonographer. Common bile duct: Diameter: 3 mm. Liver: Liver is enlarged measuring at least 22 cm in length. Nodular hepatic surface contour. No focal lesion identified. Within normal limits in parenchymal echogenicity. Portal vein is patent on color Doppler imaging with normal direction of blood flow towards the liver. IVC: No abnormality visualized.  Pancreas: Visualized portion unremarkable. Spleen: Appearance is within normal limits. Spleen measures 12.1 cm in length. Right Kidney: Length: 12.2 cm. Echogenicity within normal limits. No mass or hydronephrosis visualized. Left Kidney: Length: 12.6 cm. Echogenicity within normal limits. No mass or hydronephrosis visualized. Abdominal aorta: No aneurysm visualized. Other findings: Small volume perihepatic ascites. IMPRESSION: 1. Hepatomegaly. Nodular hepatic surface contour suggestive of cirrhosis. Correlate with liver function tests. 2. Small volume perihepatic ascites. 3. Spleen measures upper limits of normal in size. 4. Diffuse gallbladder wall thickening which may be secondary to intrinsic liver disease. No cholelithiasis and no sonographic Murphy sign to suggest cholecystitis. Electronically Signed   By: Hart Carwin  Plundo D.O.   On: 08/15/2020 17:20   DG Chest Port 1 View  Result Date: 08/20/2020 CLINICAL DATA:  Dyspnea EXAM: PORTABLE CHEST 1 VIEW COMPARISON:  08/14/2020 FINDINGS: Chronic right pleural thickening and basilar plaque formation as well as mild associated relative right-sided volume loss is again identified. Superimposed developing infiltrate is seen throughout the right lung, possibly infectious in the appropriate clinical setting. Left lung is clear. No pneumothorax or pleural effusion. Cardiomegaly is stable. Pulmonary vascularity is normal. No acute bone abnormality. IMPRESSION: Interval development of a diffuse mild pulmonary infiltrate throughout the right lung, possibly infectious in the appropriate clinical setting. Stable changes of pleural thickening, plaque formation, and mild right-sided volume loss. Stable cardiomegaly Electronically Signed   By: Fidela Salisbury MD   On: 08/20/2020 02:34   CT BONE MARROW BIOPSY & ASPIRATION  Result Date: 08/18/2020 INDICATION: 65 year old male with possible myelodysplastic syndrome referred for biopsy EXAM: CT BONE MARROW BIOPSY AND ASPIRATION  MEDICATIONS: None. ANESTHESIA/SEDATION: Moderate (conscious) sedation was employed during this procedure. A total of Versed 0 mg and Fentanyl 25 mcg was administered intravenously. Moderate Sedation Time: None minutes. The patient's level of consciousness and vital signs were monitored continuously by radiology nursing throughout the procedure under my direct supervision. FLUOROSCOPY TIME:  CT COMPLICATIONS: None PROCEDURE: Informed written consent was obtained from the patient after a thorough discussion of the procedural risks, benefits and alternatives. All questions were addressed. Maximal Sterile Barrier Technique was utilized including caps, mask, sterile gowns, sterile gloves, sterile drape, hand hygiene and skin antiseptic. A timeout was performed prior to the initiation of the procedure. The procedure risks, benefits, and alternatives were explained to the patient. Questions regarding the procedure were encouraged and answered. The patient understands and consents to the procedure. Scout CT of the pelvis was performed for surgical planning purposes. The right posterior pelvis was prepped with Chlorhexidine in a sterile fashion, and a sterile drape was applied covering the operative field. A sterile gown and sterile gloves were used for the procedure. Local anesthesia was provided with 1% Lidocaine. Posterior iliac bone was targeted for biopsy. The skin and subcutaneous tissues were infiltrated with 1% lidocaine without epinephrine. A small stab incision was made with an 11 blade scalpel, and an 11 gauge Murphy needle was advanced with CT guidance to the posterior cortex. Manual forced was used to advance the needle through the posterior cortex and the stylet was removed. A bone marrow aspirate was retrieved and passed to a cytotechnologist in the room. The Murphy needle was then advanced without the stylet for a core biopsy. The core biopsy was retrieved and also passed to a cytotechnologist. Manual  pressure was used for hemostasis and a sterile dressing was placed. No complications were encountered no significant blood loss was encountered. Patient tolerated the procedure well and remained hemodynamically stable throughout. IMPRESSION: Status post CT-guided bone marrow biopsy, with tissue specimen sent to pathology for complete histopathologic analysis Signed, Dulcy Fanny. Earleen Newport, DO [facility] Radiology Electronically Signed   By: Corrie Mckusick D.O.   On: 08/18/2020 11:46    Microbiology: Recent Results (from the past 240 hour(s))  Culture, blood (routine x 2)     Status: None   Collection Time: 08/14/20  2:19 PM   Specimen: BLOOD  Result Value Ref Range Status   Specimen Description BLOOD RIGHT ANTECUBITAL  Final   Special Requests   Final    BOTTLES DRAWN AEROBIC AND ANAEROBIC Blood Culture adequate volume  Culture   Final    NO GROWTH 5 DAYS Performed at [facility], [address] [place], [place] [address]    Report Status 08/19/2020 FINAL  Final  Culture, blood (routine x 2)     Status: None   Collection Time: 08/14/20  2:25 PM   Specimen: BLOOD  Result Value Ref Range Status   Specimen Description BLOOD RIGHT HAND  Final   Special Requests   Final    BOTTLES DRAWN AEROBIC AND ANAEROBIC Blood Culture adequate volume   Culture   Final    NO GROWTH 5 DAYS Performed at [facility], [address] [place], [place] [address]    Report Status 08/19/2020 FINAL  Final  SARS CORONAVIRUS 2 (TAT 6-24 HRS) Nasopharyngeal Nasopharyngeal Swab     Status: None   Collection Time: 08/14/20  3:46 PM   Specimen: Nasopharyngeal Swab  Result Value Ref Range Status   SARS Coronavirus 2 NEGATIVE NEGATIVE Final    Comment: (NOTE) SARS-CoV-2 target nucleic acids are NOT DETECTED.  The SARS-CoV-2 RNA is generally detectable in upper and lower respiratory specimens during the acute phase of infection. Negative results do  not preclude SARS-CoV-2 infection, do not rule out co-infections with other pathogens, and should not be used as the sole basis for treatment or other patient management decisions. Negative results must be combined with clinical observations, patient history, and epidemiological information. The expected result is Negative.  Fact Sheet for Patients: SugarRoll.be  Fact Sheet for Healthcare Providers: https://www.woods-mathews.com/  This test is not yet approved or cleared by the [place] FDA and  has been authorized for detection and/or diagnosis of SARS-CoV-2 by FDA under an Emergency Use Authorization (EUA). This EUA will remain  in effect (meaning this test can be used) for the duration of the COVID-19 declaration under Se ction 564(b)(1) of the Act, 21 U.S.C. section 360bbb-3(b)(1), unless the authorization is terminated or revoked sooner.  Performed at [facility], [place] [address] [place], [place] [address]   MRSA Next Gen by PCR, Nasal     Status: None   Collection Time: 08/19/20  9:04 PM  Result Value Ref Range Status   MRSA by PCR Next Gen NOT DETECTED NOT DETECTED Final    Comment: (NOTE) The GeneXpert MRSA Assay (FDA approved for NASAL specimens only), is one component of a comprehensive MRSA colonization surveillance program. It is not intended to diagnose MRSA infection nor to guide or monitor treatment for MRSA infections. Test performance is not FDA approved in patients less than 37 years old. Performed at [facility], [place], [place] [address]   Resp Panel by RT-PCR (Flu A&B, Covid) Nasopharyngeal Swab     Status: None   Collection Time: 08/19/20  9:35 PM   Specimen: Nasopharyngeal Swab; Nasopharyngeal(NP) swabs in vial transport medium  Result Value Ref Range Status   SARS Coronavirus 2 by RT PCR NEGATIVE NEGATIVE Final    Comment: (NOTE) SARS-CoV-2 target nucleic acids are NOT  DETECTED.  The SARS-CoV-2 RNA is generally detectable in upper respiratory specimens during the acute phase of infection. The lowest concentration of SARS-CoV-2 viral copies this assay can detect is 138 copies/mL. A negative result does not preclude SARS-Cov-2 infection and should not be used as the sole basis for treatment or other patient management decisions. A negative result may occur with  improper specimen collection/handling, submission of specimen other than nasopharyngeal swab, presence of viral mutation(s) within the areas targeted  by this assay, and inadequate number of viral copies(<138 copies/mL). A negative result must be combined with clinical observations, patient history, and epidemiological information. The expected result is Negative.  Fact Sheet for Patients:  EntrepreneurPulse.com.au  Fact Sheet for Healthcare Providers:  IncredibleEmployment.be  This test is no t yet approved or cleared by the [place] FDA and  has been authorized for detection and/or diagnosis of SARS-CoV-2 by FDA under an Emergency Use Authorization (EUA). This EUA will remain  in effect (meaning this test can be used) for the duration of the COVID-19 declaration under Section 564(b)(1) of the Act, 21 U.S.C.section 360bbb-3(b)(1), unless the authorization is terminated  or revoked sooner.       Influenza A by PCR NEGATIVE NEGATIVE Final   Influenza B by PCR NEGATIVE NEGATIVE Final    Comment: (NOTE) The Xpert Xpress SARS-CoV-2/FLU/RSV plus assay is intended as an aid in the diagnosis of influenza from Nasopharyngeal swab specimens and should not be used as a sole basis for treatment. Nasal washings and aspirates are unacceptable for Xpert Xpress SARS-CoV-2/FLU/RSV testing.  Fact Sheet for Patients: EntrepreneurPulse.com.au  Fact Sheet for Healthcare Providers: IncredibleEmployment.be  This test is not yet  approved or cleared by the [place] FDA and has been authorized for detection and/or diagnosis of SARS-CoV-2 by FDA under an Emergency Use Authorization (EUA). This EUA will remain in effect (meaning this test can be used) for the duration of the COVID-19 declaration under Section 564(b)(1) of the Act, 21 U.S.C. section 360bbb-3(b)(1), unless the authorization is terminated or revoked.  Performed at [facility], [address] [place], [place] [address]      Labs: Basic Metabolic Panel: Recent Labs  Lab 08/14/20 1211 08/15/20 0813 08/17/20 0442 08/18/20 0454 08/18/20 0813 08/19/20 0506 08/19/20 1922 08/20/20 0636  NA  --    < > 134* 133*  --  133* 132* 133*  K  --    < > 3.7 4.3  --  4.5 3.8 4.0  CL  --    < > 95* 91*  --  90* 86* 88*  CO2  --    < > 29 29  --  35* 37* 35*  GLUCOSE  --    < > 166* 144*  --  146* 147* 169*  BUN  --    < > 11 17  --  27* 23 21  CREATININE  --    < > 0.41* 0.56*  --  0.61 0.67 0.53*  CALCIUM  --    < > 8.4* 8.4*  --  8.4* 8.6* 8.3*  MG 2.1  --   --   --  2.0  --  2.0  --    < > = values in this interval not displayed.   Liver Function Tests: Recent Labs  Lab 08/14/20 1023  AST 23  ALT 13  ALKPHOS 89  BILITOT 1.0  PROT 6.8  ALBUMIN 2.4*   No results for input(s): LIPASE, AMYLASE in the last 168 hours. Recent Labs  Lab 08/18/20 0813 08/19/20 1013 08/20/20 0636  AMMONIA 36* 47* 30   CBC: Recent Labs  Lab 08/14/20 1023 08/15/20 0813 08/16/20 0937 08/17/20 0442 08/18/20 0454 08/19/20 0506 08/20/20 0636  WBC 2.3*   < > 2.1* 2.3* 2.2* 1.7* 1.3*  NEUTROABS 1.2*  --   --  1.2* 1.3* 0.7* 0.7*  HGB 11.5*   < > 11.2* 10.9* 10.0* 9.1* 9.5*  HCT 34.2*   < > 32.4* 31.7*  28.9* 26.3* 27.3*  MCV 90.7   < > 88.8 89.8 89.5 89.8 89.8  PLT 41*   < > 38* 40* 33* 25* 26*   < > = values in this interval not displayed.   Cardiac Enzymes: No results for input(s): CKTOTAL, CKMB, CKMBINDEX, TROPONINI in the last 168  hours. BNP: BNP (last 3 results) Recent Labs    08/14/20 1023  BNP 217.5*    ProBNP (last 3 results) No results for input(s): PROBNP in the last 8760 hours.  CBG: Recent Labs  Lab 08/19/20 1143 08/19/20 1644 08/19/20 2011 08/19/20 2045 08/20/20 0738  GLUCAP 185* 138* 160* 164* 149*       Signed:  Irine Seal MD.  [facility] 08/20/2020, 9:48 AM

## 2020-08-20 NOTE — Anesthesia Postprocedure Evaluation (Signed)
Anesthesia Post Note  Patient: Gregg Williams  Procedure(s) Performed: CARDIOVERSION  Patient location during evaluation: ICU Anesthesia Type: General Level of consciousness: awake and alert and oriented Pain management: pain level controlled Vital Signs Assessment: post-procedure vital signs reviewed and stable Respiratory status: spontaneous breathing, nonlabored ventilation and respiratory function stable Cardiovascular status: blood pressure returned to baseline and stable Postop Assessment: no signs of nausea or vomiting Anesthetic complications: no   No notable events documented.   Last Vitals:  Vitals:   08/20/20 0819 08/20/20 0820  BP:    Pulse: (!) 50 72  Resp: 14 17  Temp:    SpO2: 100% 100%    Last Pain:  Vitals:   08/20/20 0215  TempSrc:   PainSc: 0-No pain                 [name]

## 2020-08-20 NOTE — Progress Notes (Signed)
PT Cancellation Note  Patient Details Name: [name] MRN: [id number] DOB: Feb 18, 1956   Cancelled Treatment:    Reason Eval/Treat Not Completed: Medical issues which prohibited therapy: Pt transferred to higher level of care.  Per protocol will complete PT orders at this time but will reassess pt pending a change in status upon receipt of new PT orders.    Linus Salmons PT, DPT 08/20/20, 8:54 AM

## 2020-08-20 NOTE — Progress Notes (Signed)
Pt transferred to [facility] via [facility] transport.  AVS printed, reviewed and sent in EMS information packet.  All VS stable at time of transfer.

## 2020-08-20 NOTE — CV Procedure (Signed)
    Cardioversion Note  Gregg Williams [id number] April 04, 1955  Procedure: DC Cardioversion Indications: Unstable atrial flutter  Procedure Details Consent: Obtained Time Out: Verified patient identification, verified procedure, site/side was marked, verified correct patient position, special equipment/implants available, Radiology Safety Procedures followed,  medications/allergies/relevent history reviewed, required imaging and test results available.  Performed  The patient has not been on anticoagulation due to pancytopenia with platelets < 50,000.  Onset of a-flutter was ~14 hours ago.  The patient received IV propofol for sedation.  Synchronous cardioversion was performed at 75 joules.  The cardioversion was successful with restoration of sinus rhythm with PAC's.  Complications: No apparent complications Patient did tolerate procedure well.  Nelva Bush., MD 08/20/2020, 8:09 AM

## 2020-08-20 NOTE — Progress Notes (Signed)
Was called per hospitalist team this am due to persistently elevated heart rates in the 140s with low normal BP's.  Last ekg reviewed showing atrial flutter with 2:1 block. Patient was placed on amiodarone gtt and coreg with no improvement in heart rate.  He is pending transfer to [facility] due to pancytopenia, ?myeloproliferative disorder.  Pancytopenic with plt 25 preventing use of anticoagulation at this time.  Plan -cont amiodarone drip -keep patient npo -plan bedside TEE guided cardioversion in the am.  -perform DCCV if patient becomes hemodynamically unstable  Although stroke risk with chemically induced cardioversion (via amio if achieved) is same as performing a "blind" DCCV, I believe the approach stated in the plan above is safer for the patient. Risk of stroke is higher if a thrombus is noted on TEE and will therefore change the clinical picture. Hopefully, no thrombus is found on TEE so patient can be "more safely cardioverted". But again if patient becomes hemodynamically unstable, then we will have no choice but to cardiovert urgently/emergently.  Discussed with hospitalist team.  Signed, Kate Sable, M.D. 08/20/20 [place], [place]

## 2020-08-20 NOTE — Progress Notes (Signed)
Brief PCCM Note  I was contacted by the overnight Hospitalist regarding tentative plan for emergent cardioversion for atrial flutter if patient becomes hemodynamically unstable. This plan was discussed between Hospitalist and Cardiologist.  Currently patient with HR 130s with BP 91/63 (MAP 73). SpO2 99% on 2L O2  No indication for cardioversion at this time. If patient becomes unstable, please page PCCM team and we will be promptly involved in his care.  Rodman Pickle, M.D. [facility] Pulmonary/Critical Care Medicine 08/20/2020 2:41 AM

## 2020-08-20 NOTE — Significant Event (Signed)
[facility]:  Patient remains in atrial flutter with 2: 1 AV block and ventricular rates around 150 bpm.  Blood pressure has been borderline low.  Patient complains of some shortness of breath and has also been more lethargic than usual.  He denies chest pain and palpitations.  Labs notable for WBC 1.3 with ANC of 0.7, hemoglobin 9.5, platelets 26.  Patient currently awaiting transfer to [facility] for hematology evaluation for possible leukemia.  He remains unstable with his atrial flutter and borderline hypotension and lethargy.  Given his relatively short duration of atrial flutter (onset at 1748 yesterday) and pancytopenia, I think it would be safest to proceed with urgent cardioversion without TEE as the risks of TEE outweigh benefits.  Patient cannot be safely anticoagulated at this time.  Increased risk for stroke was discussed with Gregg Williams.  Other questions have been answered and he agrees to proceed.  Shared Decision Making/Informed Consent The risks (stroke, cardiac arrhythmias rarely resulting in the need for a temporary or permanent pacemaker, skin irritation or burns and complications associated with conscious sedation including aspiration, arrhythmia, respiratory failure and death), benefits (restoration of normal sinus rhythm) and alternatives of a direct current cardioversion were explained in detail to Gregg Williams and he agrees to proceed.   Nelva Bush, MD [facility] 08/20/20 8:03 AM

## 2020-08-20 NOTE — Progress Notes (Signed)
Chaplain Maggie made follow up visit with patient and his sister. Room was made for storytelling and empathetic listening with his sister. Patient is preparing for transport therefore no further support needed.

## 2020-08-20 NOTE — Progress Notes (Signed)
Progress Note  Patient Name: Gregg Williams Date of Encounter: 08/20/2020  [facility] Cardiologist: Nelva Bush, MD  Subjective   More somnolent this morning and complaining of shortness of breath.  No chest pain or palpitations.  He went into atrial flutter with 2:1 AV block yesterday at 5:48 PM and has remained tachycardic with ventricular rates near 158 bpm despite IV amiodarone.  Blood pressure has also been borderline low, precluding more aggressive rate control therapy.  Patient is currently awaiting transfer to [facility] for further management of pancytopenia and concern for leukemia.  Inpatient Medications    Scheduled Meds:  aspirin EC  81 mg Oral Daily   carvedilol  25 mg Oral BID WC   Chlorhexidine Gluconate Cloth  6 each Topical Daily   cyanocobalamin  1,000 mcg Intramuscular Daily   docusate sodium  100 mg Oral Daily   insulin aspart  0-5 Units Subcutaneous QHS   insulin aspart  0-9 Units Subcutaneous TID WC   lactulose  20 g Oral BID   lidocaine  1 patch Transdermal Q24H   mouth rinse  15 mL Mouth Rinse BID   mometasone-formoterol  2 puff Inhalation BID   pantoprazole (PROTONIX) IV  40 mg Intravenous Q24H   rifaximin  550 mg Oral BID   spironolactone  25 mg Oral Daily   Continuous Infusions:  amiodarone 30 mg/hr (08/20/20 0700)   PRN Meds: acetaminophen, alum & mag hydroxide-simeth, dextromethorphan-guaiFENesin, hydrALAZINE, levalbuterol, methocarbamol, metoprolol tartrate, naphazoline-glycerin, ondansetron (ZOFRAN) IV, oxyCODONE-acetaminophen, traZODone   Vital Signs    Vitals:   08/20/20 0700 08/20/20 0715 08/20/20 0730 08/20/20 0745  BP: $Re'95/61 94/63 93/65 'ips$ 99/63  Pulse: (!) 147 (!) 148 (!) 147 (!) 148  Resp: (!) 22 (!) 21 (!) 24 (!) 25  Temp:      TempSrc:      SpO2: 99% 99% 99% 99%  Weight:      Height:        Intake/Output Summary (Last 24 hours) at 08/20/2020 0815 Last data filed at 08/20/2020 0700 Gross per 24 hour  Intake 1125.7 ml   Output 1325 ml  Net -199.3 ml   Last 3 Weights 08/19/2020 08/14/2020 07/16/2020  Weight (lbs) 276 lb 7.3 oz 272 lb 274 lb  Weight (kg) 125.4 kg 123.378 kg 124.286 kg      Telemetry    Sinus rhythm with PACs and atrial runs up until 5:48 PM yesterday when patient went into atrial flutter with 2:1 AV block - Personally Reviewed  ECG    Atrial flutter with 2:1 AV block and nonspecific ST/T changes - Personally Reviewed  Physical Exam   GEN: No acute distress.  Somnolent.  Pallor noted. Neck: Unable to assess JVP due to body habitus. Cardiac: Tachycardic but regular without murmurs. Respiratory: Mildly diminished anteriorly. GI: Soft, nontender, non-distended  MS: Trace pretibial edema; No deformity. Neuro:  Nonfocal  Psych: Somnolent but arousable  Labs    High Sensitivity Troponin:   Recent Labs  Lab 08/14/20 1023 08/14/20 1211  TROPONINIHS 7 7      Chemistry Recent Labs  Lab 08/14/20 1023 08/15/20 0813 08/19/20 0506 08/19/20 1922 08/20/20 0636  NA 137   < > 133* 132* 133*  K 3.4*   < > 4.5 3.8 4.0  CL 93*   < > 90* 86* 88*  CO2 31   < > 35* 37* 35*  GLUCOSE 129*   < > 146* 147* 169*  BUN 14   < > 27*  23 21  CREATININE 0.60*   < > 0.61 0.67 0.53*  CALCIUM 8.4*   < > 8.4* 8.6* 8.3*  PROT 6.8  --   --   --   --   ALBUMIN 2.4*  --   --   --   --   AST 23  --   --   --   --   ALT 13  --   --   --   --   ALKPHOS 89  --   --   --   --   BILITOT 1.0  --   --   --   --   GFRNONAA >60   < > >60 >60 >60  ANIONGAP 13   < > 8 9 10    < > = values in this interval not displayed.     Hematology Recent Labs  Lab 08/18/20 0454 08/19/20 0506 08/20/20 0636  WBC 2.2* 1.7* 1.3*  RBC 3.23* 2.93* 3.04*  HGB 10.0* 9.1* 9.5*  HCT 28.9* 26.3* 27.3*  MCV 89.5 89.8 89.8  MCH 31.0 31.1 31.3  MCHC 34.6 34.6 34.8  RDW 14.5 14.5 14.3  PLT 33* 25* 26*    BNP Recent Labs  Lab 08/14/20 1023  BNP 217.5*     DDimer No results for input(s): DDIMER in the last 168 hours.    Radiology    CT HEAD WO CONTRAST  Result Date: 08/19/2020 CLINICAL DATA:  Delirium. EXAM: CT HEAD WITHOUT CONTRAST TECHNIQUE: Contiguous axial images were obtained from the base of the skull through the vertex without intravenous contrast. COMPARISON:  No pertinent prior exams available for comparison. FINDINGS: Brain: Cerebral volume is normal for age. There is no acute intracranial hemorrhage. No demarcated cortical infarct. No extra-axial fluid collection. No evidence of an intracranial mass. No midline shift. Vascular: No hyperdense vessel.  Atherosclerotic calcifications. Skull: Normal. Negative for fracture or focal lesion. Sinuses/Orbits: Visualized orbits show no acute finding. Mild left sphenoid sinus mucosal thickening. Other: Trace fluid within the right mastoid air cells. IMPRESSION: No evidence of acute intracranial abnormality. Mild left sphenoid sinus mucosal thickening. Trace right mastoid effusion. Electronically Signed   By: 08/21/2020 DO   On: 08/19/2020 12:57   DG Chest Port 1 View  Result Date: 08/20/2020 CLINICAL DATA:  Dyspnea EXAM: PORTABLE CHEST 1 VIEW COMPARISON:  08/14/2020 FINDINGS: Chronic right pleural thickening and basilar plaque formation as well as mild associated relative right-sided volume loss is again identified. Superimposed developing infiltrate is seen throughout the right lung, possibly infectious in the appropriate clinical setting. Left lung is clear. No pneumothorax or pleural effusion. Cardiomegaly is stable. Pulmonary vascularity is normal. No acute bone abnormality. IMPRESSION: Interval development of a diffuse mild pulmonary infiltrate throughout the right lung, possibly infectious in the appropriate clinical setting. Stable changes of pleural thickening, plaque formation, and mild right-sided volume loss. 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 ANESTHESIA/SEDATION: Moderate (conscious) sedation was employed during this procedure. A total of Versed 0 mg and Fentanyl 25 mcg was administered intravenously. Moderate Sedation Time: None minutes. The patient's level of consciousness and vital signs were monitored continuously by radiology nursing throughout the procedure under my direct supervision. FLUOROSCOPY TIME:  CT COMPLICATIONS: None PROCEDURE: Informed written consent was  obtained from the patient after a thorough discussion of the procedural risks, benefits and alternatives. All questions were addressed. Maximal Sterile Barrier Technique was utilized including caps, mask, sterile gowns, sterile gloves, sterile drape, hand hygiene and skin antiseptic. A timeout was performed prior to the initiation of the procedure. The procedure risks, benefits, and alternatives were explained to the patient. Questions regarding the procedure were encouraged and answered. The patient understands and consents to the procedure. Scout CT of the pelvis was performed for surgical planning purposes. The right posterior pelvis was prepped with Chlorhexidine in a sterile fashion, and a sterile drape was applied covering the operative field. A sterile gown and sterile gloves were used for the procedure. Local anesthesia was provided with 1% Lidocaine. Posterior iliac bone was targeted for biopsy. The skin and subcutaneous tissues were infiltrated with 1% lidocaine without epinephrine. A small stab incision was made with an 11 blade scalpel, and an 11 gauge Murphy needle was advanced with CT guidance to the posterior cortex. Manual forced was used to advance the needle through the posterior cortex and the stylet was removed. A bone marrow aspirate was retrieved and passed to a cytotechnologist in the room. The Murphy  needle was then advanced without the stylet for a core biopsy. The core biopsy was retrieved and also passed to a cytotechnologist. Manual pressure was used for hemostasis and a sterile dressing was placed. No complications were encountered no significant blood loss was encountered. Patient tolerated the procedure well and remained hemodynamically stable throughout. IMPRESSION: Status post CT-guided bone marrow biopsy, with tissue specimen sent to pathology for complete histopathologic analysis Signed, Dulcy Fanny. Earleen Newport, DO [facility] Electronically Signed   By: Corrie Mckusick D.O.   On: 08/18/2020 11:46    Cardiac Studies   TTE (07/17/2020):  1. Left ventricular ejection fraction, by estimation, is >55%. The left  ventricle has normal function. Left ventricular endocardial border not  optimally defined to evaluate regional wall motion. There is mild left  ventricular hypertrophy. Left  ventricular diastolic function could not be evaluated.   2. Right ventricular systolic function is mildly reduced. The right  ventricular size is normal. Mildly increased right ventricular wall  thickness. Tricuspid regurgitation signal is inadequate for assessing PA  pressure.   3. The mitral valve is grossly normal. No evidence of mitral valve  regurgitation.   4. The aortic valve was not well visualized. Aortic valve regurgitation  not well assessed.   5. Aortic dilatation noted. There is borderline dilatation of the aortic  root, measuring 39 mm.   6. The inferior vena cava is normal in size with greater than 50%  respiratory variability, suggesting right atrial pressure of 3 mmHg.  1. Left ventricular ejection fraction, by estimation, is >55%. The left  ventricle has normal function. Left ventricular endocardial border not  optimally defined to evaluate regional wall motion. There is mild left  ventricular hypertrophy. Left  ventricular diastolic  function could not be evaluated.   2. Right ventricular systolic function is mildly reduced. The right  ventricular size is normal. Mildly increased right ventricular wall  thickness. Tricuspid regurgitation signal is inadequate for assessing PA  pressure.   3. The mitral valve is grossly normal. No evidence of mitral valve  regurgitation.   4. The aortic valve was not well visualized. Aortic valve regurgitation  not well assessed.   5. Aortic dilatation noted. There is borderline dilatation of the aortic  root, measuring 39  mm.   6. The inferior vena cava is normal in size with greater than 50%  respiratory variability, suggesting right atrial pressure of 3 mmHg.   Patient Profile     65 y.o. male admitted with back pain found to have pancytopenia.  Hospitalization complicated by frequent supraventricular ectopy and development of atrial flutter with 2:1 AV block yesterday evening.  Assessment & Plan    Atrial flutter: Patient has remained in atrial flutter overnight with ventricular rates near 150 bpm.  There was question of paroxysmal atrial fibrillation yesterday lasting 2 hours while still on the floor.  Blood pressure is borderline low with associated somnolence concerning for some degree of hypoperfusion.  Patient was successfully cardioverted to sinus rhythm with PACs without TEE or anticoagulation in the setting of pancytopenia and severe thrombocytopenia. Continue IV amiodarone. Discontinue carvedilol for now given borderline hypotension.  Judicious reintroduction of beta-blocker recommended as blood pressure tolerates. Defer anticoagulation given marked thrombocytopenia and relatively short duration of atrial flutter before cardioversion (approximately 14 hours).  Pancytopenia: Concern for myeloproliferative disorder or leukemia.  Patient awaiting transfer to [facility] for further evaluation and management. Per primary team.  Chronic HFpEF: Patient appears mildly volume  overloaded.  Would maintain net even to slightly negative fluid balance in the setting of soft blood pressure.  For questions or updates, please contact [place] Please consult [email or web address] for contact info under [facility].     Signed, Nelva Bush, MD  08/20/2020, 8:15 AM

## 2020-08-20 NOTE — Anesthesia Preprocedure Evaluation (Signed)
Anesthesia Evaluation  Patient identified by MRN, date of birth, ID band Patient awake    Reviewed: Allergy & Precautions, NPO status , Patient's Chart, lab work & pertinent test results  History of Anesthesia Complications Negative for: history of anesthetic complications  Airway Mallampati: III  TM Distance: >3 FB Neck ROM: Full    Dental  (+) Poor Dentition   Pulmonary neg sleep apnea, neg COPD, former smoker,    breath sounds clear to auscultation- rhonchi (-) wheezing      Cardiovascular hypertension, Pt. on medications (-) CAD, (-) Past MI, (-) Cardiac Stents and (-) CABG + dysrhythmias Atrial Fibrillation  Rhythm:Regular Rate:Normal - Systolic murmurs and - Diastolic murmurs    Neuro/Psych neg Seizures negative neurological ROS  negative psych ROS   GI/Hepatic negative GI ROS, Neg liver ROS,   Endo/Other  diabetes, Oral Hypoglycemic Agents  Renal/GU negative Renal ROS     Musculoskeletal negative musculoskeletal ROS (+)   Abdominal (+) + obese,   Peds  Hematology  (+) anemia , pancytopenia   Anesthesia Other Findings Past Medical History: No date: Chest pain     Comment:  a. 06/2020 MV: EF >65%. No ischemia/infarct-->low risk. No date: Chronic back pain No date: Chronic respiratory failure ([place])     Comment:  a. Has home O2 to be used prn. No date: Coronary artery calcification seen on CT scan     Comment:  a. Pt wishes to defer statin rx. No date: Diabetes mellitus without complication (HCC) No date: Essential hypertension No date: Morbid obesity ([place])   Reproductive/Obstetrics                             Anesthesia Physical Anesthesia Plan  ASA: 3 and emergent  Anesthesia Plan: General   Post-op Pain Management:    Induction: Intravenous  PONV Risk Score and Plan: 1 and Propofol infusion  Airway Management Planned: Natural Airway  Additional Equipment:    Intra-op Plan:   Post-operative Plan:   Informed Consent: I have reviewed the patients History and Physical, chart, labs and discussed the procedure including the risks, benefits and alternatives for the proposed anesthesia with the patient or authorized representative who has indicated his/her understanding and acceptance.     Dental advisory given  Plan Discussed with: CRNA and Anesthesiologist  Anesthesia Plan Comments:         Anesthesia Quick Evaluation

## 2020-08-20 NOTE — Progress Notes (Signed)
@   outset of shift, pt remains in a-flutter (HR 140s-150s), SBP 90s.   Cardioversion completed @ bedside near 0800.  Cardioversion successful, pt's HR in 70s-80s. SBP increasing following procedure (SBP 110s).   [facility] updated via phone regarding procedure and vitals following cardioversion.   Pt on 2L [place]. Pt transitioned to heart healthy diet and thin liquids. Pt tolerating PO meds.   Sister @ bedside.   This afternoon, coordinating pt transport to [facility]. Report called to life flight RN. [facility], was instructed to have night-shift nurse to call and give report later when transport is in transit.   [facility] bed is [id number], Dr. Sunny Schlein will be accepting MD.

## 2020-08-20 NOTE — Progress Notes (Addendum)
PROGRESS NOTE    Gregg Williams  ZJI:[id number] DOB: 1955/11/07 DOA: 08/14/2020 PCP: Leonard Downing, MD   No chief complaint on file.   Brief Narrative:  Gregg Williams is an 65 y.o. male with medical history significant of HTN, HLD, pulmonary hypertension on 1.5 L oxygen at home, CAD, obesity, thrombocytopenia, chronic back pain, who presents with lower back pain and abnormal lab. Concern for myeloproliferative disease and BMB pending.  Stay complicated by atrial tachycardia and worsening dyspnea.     Assessment & Plan:   Principal Problem:   Lower back pain Active Problems:   Diabetes mellitus without complication (HCC)   Thrombocytopenia (HCC)   Pulmonary hypertension (HCC)   Chronic respiratory failure with hypoxia (HCC)   Hypokalemia   CAD (coronary artery disease)   SIRS (systemic inflammatory response syndrome) (HCC)   Obesity, Class III, BMI 40-49.9 (morbid obesity) (HCC)   Pancytopenia (HCC)   Atrial tachycardia (HCC)   Leg edema   Acute metabolic encephalopathy   Myeloproliferative neoplasm (HCC)   AF (paroxysmal atrial fibrillation) (HCC)   Atrial flutter, paroxysmal (HCC)   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 -Patient noted to have had progressive worsening back pain for the past several weeks with radiation of back pain to the right leg laterally with also some complaints of mild right leg weakness. -No bladder or bowel incontinence. -It is noted that patient had some elevated markers at PCPs office with a CRP of 30, sed rate of 130, alk phosphatase of 140s. -MRI L-spine negative for spinal cord compression, showed concerns for myeloproliferative disorder, also noted was right sided disc protrusion L5-S1 with mild progression since 2020, flattening and displacement of right S1 nerve root.  Multilevel degenerative change  throughout the lumbar spine otherwise similar to prior MRI. -Patient currently denies any significant pain. -Continue current pain regimen, with Percocet, continue Lidoderm patch.   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   -Will need outpatient follow-up with primary neurosurgeon.  2.  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 -Patient with no overt bleeding. -Hematology consulted and patient status post bone marrow biopsy 08/18/2020.   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 -Counts trending down and white count currently at 1.3, hemoglobin 9.5, platelet count 26 -Transfusion threshold hemoglobin < 7, platelet count < 10 or  > 20 with active bleeding. -DC aspirin. 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   -Patient hopeful for transfer to [facility] today once cardiac issues have been stabilized.    3.  Acute metabolic encephalopathy/?  Hepatic encephalopathy -Per RN patient with confusion over the past few days which slowly improved.   -No asterixis is noted on examination.   -Patient noted to have an elevated ammonia level, abdominal ultrasound with findings concerning for possible cirrhosis.  -Head CT done unremarkable. -Patient with no focal neurological deficits. -Patient placed on lactulose twice daily as well as Xifaxan.   -Narcotic pain medication dose reduced.   -Patient with clinical improvement this morning.   -Continue lactulose, Xifaxan.   -Supportive care.    4.  Atrial flutter/paroxysmal A.  fib/paroxysmal atrial tachycaria/??  SVT -Patient seen in consultation by cardiology.  Patient noted to  have some PACs early on in the hospitalization and yesterday(6/23) noted to have some runs of atrial fibrillation in the morning which improved transiently yesterday morning with increased dose of Coreg and IV Lopressor. -Yesterday evening patient noted to be in persistent atrial flutter and initial concerns for SVT. -Patient underwent carotid massage, adenosine x2 initially due to concerns for SVT. -EKG done consistent with atrial flutter. -Patient placed on amiodarone drip and received amiodarone bolus 150 mg x 2 however heart rate persistent in the high 140s. -Cardiology following and patient for cardioversion this morning. -Would continue IV amiodarone if cardioversion is successful. -Coreg discontinued. -Per cardiology.  5.  Acute on chronic diastolic CHF exacerbation/pulmonary hypertension -Patient assessed by cardiology and is noted that patient has some crackles on examination with lower extremity edema on 08/17/2020. -Urine output 1.325 L over the past 24 hours.  -Continue Aldactone.  -Continue amiodarone due to A-flutter.  -Coreg discontinued.  -DC IV Lasix this morning due to systolic blood pressures in the 90s.  -Per cardiology.    6.  Possible cirrhosis per abdominal ultrasound -Abdominal ultrasound with hepatomegaly and concerns for possible cirrhosis. -Patient denies any history of cirrhosis. -Continue Aldactone.  DC IV Lasix due to systolic blood pressures in the 90s.   -Continue lactulose, Xifaxan due to concerns for hepatic encephalopathy.   -Outpatient follow-up with GI.    7.  Lower extremity edema -Questionable etiology differential included possible cirrhosis, versus CHF, versus morbid obesity. -Improving with diuresis. -Urine output 1.325 L over the past 24 hours.  -DC IV Lasix due to systolic blood pressures in the 90s.  -Continue Aldactone.  -Cardiology following.    8.  Diabetes mellitus type 2 without complication -Hemoglobin A1c 7.1. -Patient noted to be  on Jardiance and Amaryl. -CBG 149 this morning.   -Continue SSI.   9.  Hypokalemia -Repleted.  -Potassium at 4.0.   10.  Hyperlipidemia -Patient not on a statin. -Outpatient follow-up with PCP.  11.  Coronary artery disease -Stable. -Continue amiodarone drip, Aldactone. -DC aspirin secondary to thrombocytopenia. -DC IV Lasix with systolic blood pressures in the 90s. -Cardiology following.  12.  Morbid obesity  13.  Chronic respiratory failure -On 1.5 to 2 L nasal cannula chronically. -Outpatient follow-up.   DVT prophylaxis: SCDs Code Status: Full Family Communication: No family at bedside.  Disposition:   Status is: Inpatient  Remains inpatient appropriate because:Altered mental status  Dispo: The patient is from: Home              Anticipated d/c is to: Transfer to [facility].              Patient currently is not medically stable to d/c.   Difficult to place patient No       Consultants:  Cardiology: Dr. Saunders Revel 08/17/2020 Interventional radiology: Dr. Anselm Pancoast 08/16/2020 Hematology/oncology: Dr. Rogue Bussing 08/16/2020 PCCM  Procedures:  Chest x-ray 08/14/2020 MRI L-spine 08/14/2020 Abdominal ultrasound 08/15/2020 CT bone marrow biopsy and aspiration 08/18/2020 per Dr. Earleen Newport, IR Cardioversion pending 08/20/2020 CT head 08/19/2020     Antimicrobials:  IV cefepime 08/14/2020>>>> 08/16/2020 IV vancomycin 08/14/2020>>>>> 08/16/2020   Subjective: Patient sitting up in bed in the ICU.  Denies any chest pain or shortness of breath.  No abdominal pain.  Denies any bleeding.  Alert and oriented to self place and time.  Heart rate remained persistently in the 140s overnight and patient for cardioversion this  morning per cardiology.    Objective: Vitals:   08/20/20 0630 08/20/20 0645 08/20/20 0700 08/20/20 0715  BP: (!) 86/67 93/61 95/61 94/63  Pulse: (!) 147 (!) 147 (!) 147 (!) 148  Resp: (!) 21 (!) 21 (!) 22 (!) 21  Temp:      TempSrc:      SpO2: 99% 99% 99% 99%  Weight:       Height:        Intake/Output Summary (Last 24 hours) at 08/20/2020 0747 Last data filed at 08/20/2020 0700 Gross per 24 hour  Intake 1125.7 ml  Output 1325 ml  Net -199.3 ml    Filed Weights   08/14/20 1008 08/19/20 2045  Weight: 123.4 kg 125.4 kg    Examination:  General exam: NAD.  Respiratory system: CTA B anterior lung fields.  No wheezes, no rhonchi.  Gregg respiratory effort.   Cardiovascular system: Irregularly irregular.  No JVD.  No murmurs rubs or gallops.  1-2+ bilateral lower extremity edema.  Gastrointestinal system: Abdomen is soft, distended, nontender, positive bowel sounds.  No rebound.  No guarding.   Central nervous system: Alert and oriented x3.  Moving extremities spontaneously.  No focal neurological deficits.   Extremities: Symmetric 5 x 5 power. Skin: No rashes, lesions or ulcers Psychiatry: Judgement and insight appear fair. Mood & affect appropriate.     Data Reviewed: I have personally reviewed following labs and imaging studies  CBC: Recent Labs  Lab 08/14/20 1023 08/15/20 0813 08/16/20 0937 08/17/20 0442 08/18/20 0454 08/19/20 0506 08/20/20 0636  WBC 2.3*   < > 2.1* 2.3* 2.2* 1.7* 1.3*  NEUTROABS 1.2*  --   --  1.2* 1.3* 0.7* 0.7*  HGB 11.5*   < > 11.2* 10.9* 10.0* 9.1* 9.5*  HCT 34.2*   < > 32.4* 31.7* 28.9* 26.3* 27.3*  MCV 90.7   < > 88.8 89.8 89.5 89.8 89.8  PLT 41*   < > 38* 40* 33* 25* 26*   < > = values in this interval not displayed.     Basic Metabolic Panel: Recent Labs  Lab 08/14/20 1211 08/15/20 0813 08/17/20 0442 08/18/20 0454 08/18/20 0813 08/19/20 0506 08/19/20 1922 08/20/20 0636  NA  --    < > 134* 133*  --  133* 132* 133*  K  --    < > 3.7 4.3  --  4.5 3.8 4.0  CL  --    < > 95* 91*  --  90* 86* 88*  CO2  --    < > 29 29  --  35* 37* 35*  GLUCOSE  --    < > 166* 144*  --  146* 147* 169*  BUN  --    < > 11 17  --  27* 23 21  CREATININE  --    < > 0.41* 0.56*  --  0.61 0.67 0.53*  CALCIUM  --    < >  8.4* 8.4*  --  8.4* 8.6* 8.3*  MG 2.1  --   --   --  2.0  --  2.0  --    < > = values in this interval not displayed.     GFR: Estimated Creatinine Clearance: 123.5 mL/min (A) (by C-G formula based on SCr of 0.53 mg/dL (L)).  Liver Function Tests: Recent Labs  Lab 08/14/20 1023  AST 23  ALT 13  ALKPHOS 89  BILITOT 1.0  PROT 6.8  ALBUMIN 2.4*     CBG: Recent  Labs  Lab 08/19/20 1143 08/19/20 1644 08/19/20 2011 08/19/20 2045 08/20/20 0738  GLUCAP 185* 138* 160* 164* 149*      Recent Results (from the past 240 hour(s))  Culture, blood (routine x 2)     Status: None   Collection Time: 08/14/20  2:19 PM   Specimen: BLOOD  Result Value Ref Range Status   Specimen Description BLOOD RIGHT ANTECUBITAL  Final   Special Requests   Final    BOTTLES DRAWN AEROBIC AND ANAEROBIC Blood Culture adequate volume   Culture   Final    NO GROWTH 5 DAYS Performed at [facility], [address], [place], [place] [address]    Report Status 08/19/2020 FINAL  Final  Culture, blood (routine x 2)     Status: None   Collection Time: 08/14/20  2:25 PM   Specimen: BLOOD  Result Value Ref Range Status   Specimen Description BLOOD RIGHT HAND  Final   Special Requests   Final    BOTTLES DRAWN AEROBIC AND ANAEROBIC Blood Culture adequate volume   Culture   Final    NO GROWTH 5 DAYS Performed at [facility], [address], [place], [place] [address]    Report Status 08/19/2020 FINAL  Final  SARS CORONAVIRUS 2 (TAT 6-24 HRS) Nasopharyngeal Nasopharyngeal Swab     Status: None   Collection Time: 08/14/20  3:46 PM   Specimen: Nasopharyngeal Swab  Result Value Ref Range Status   SARS Coronavirus 2 NEGATIVE NEGATIVE Final    Comment: (NOTE) SARS-CoV-2 target nucleic acids are NOT DETECTED.  The SARS-CoV-2 RNA is generally detectable in upper and lower respiratory specimens during the acute phase of infection. Negative results do not preclude SARS-CoV-2 infection, do  not rule out co-infections with other pathogens, and should not be used as the sole basis for treatment or other patient management decisions. Negative results must be combined with clinical observations, patient history, and epidemiological information. The expected result is Negative.  Fact Sheet for Patients: SugarRoll.be  Fact Sheet for Healthcare Providers: https://www.woods-mathews.com/  This test is not yet approved or cleared by the [place] FDA and  has been authorized for detection and/or diagnosis of SARS-CoV-2 by FDA under an Emergency Use Authorization (EUA). This EUA will remain  in effect (meaning this test can be used) for the duration of the COVID-19 declaration under Se ction 564(b)(1) of the Act, 21 U.S.C. section 360bbb-3(b)(1), unless the authorization is terminated or revoked sooner.  Performed at [facility], [place] [address], [place], [place] [address]   MRSA Next Gen by PCR, Nasal     Status: None   Collection Time: 08/19/20  9:04 PM  Result Value Ref Range Status   MRSA by PCR Next Gen NOT DETECTED NOT DETECTED Final    Comment: (NOTE) The GeneXpert MRSA Assay (FDA approved for NASAL specimens only), is one component of a comprehensive MRSA colonization surveillance program. It is not intended to diagnose MRSA infection nor to guide or monitor treatment for MRSA infections. Test performance is not FDA approved in patients less than 51 years old. Performed at [facility], [address] [place], [place] [address]   Resp Panel by RT-PCR (Flu A&B, Covid) Nasopharyngeal Swab     Status: None   Collection Time: 08/19/20  9:35 PM   Specimen: Nasopharyngeal Swab; Nasopharyngeal(NP) swabs in vial transport medium  Result Value Ref Range Status   SARS Coronavirus 2 by RT PCR NEGATIVE NEGATIVE Final  Comment: (NOTE) SARS-CoV-2 target nucleic acids are NOT DETECTED.  The SARS-CoV-2 RNA is  generally detectable in upper respiratory specimens during the acute phase of infection. The lowest concentration of SARS-CoV-2 viral copies this assay can detect is 138 copies/mL. A negative result does not preclude SARS-Cov-2 infection and should not be used as the sole basis for treatment or other patient management decisions. A negative result may occur with  improper specimen collection/handling, submission of specimen other than nasopharyngeal swab, presence of viral mutation(s) within the areas targeted by this assay, and inadequate number of viral copies(<138 copies/mL). A negative result must be combined with clinical observations, patient history, and epidemiological information. The expected result is Negative.  Fact Sheet for Patients:  EntrepreneurPulse.com.au  Fact Sheet for Healthcare Providers:  IncredibleEmployment.be  This test is no t yet approved or cleared by the [place] FDA and  has been authorized for detection and/or diagnosis of SARS-CoV-2 by FDA under an Emergency Use Authorization (EUA). This EUA will remain  in effect (meaning this test can be used) for the duration of the COVID-19 declaration under Section 564(b)(1) of the Act, 21 U.S.C.section 360bbb-3(b)(1), unless the authorization is terminated  or revoked sooner.       Influenza A by PCR NEGATIVE NEGATIVE Final   Influenza B by PCR NEGATIVE NEGATIVE Final    Comment: (NOTE) The Xpert Xpress SARS-CoV-2/FLU/RSV plus assay is intended as an aid in the diagnosis of influenza from Nasopharyngeal swab specimens and should not be used as a sole basis for treatment. Nasal washings and aspirates are unacceptable for Xpert Xpress SARS-CoV-2/FLU/RSV testing.  Fact Sheet for Patients: EntrepreneurPulse.com.au  Fact Sheet for Healthcare Providers: IncredibleEmployment.be  This test is not yet approved or cleared by the [place] FDA and has been authorized for detection and/or diagnosis of SARS-CoV-2 by FDA under an Emergency Use Authorization (EUA). This EUA will remain in effect (meaning this test can be used) for the duration of the COVID-19 declaration under Section 564(b)(1) of the Act, 21 U.S.C. section 360bbb-3(b)(1), unless the authorization is terminated or revoked.  Performed at [facility], [address], [place], [place] [address]           Radiology Studies: CT HEAD WO CONTRAST  Result Date: 08/19/2020 CLINICAL DATA:  Delirium. EXAM: CT HEAD WITHOUT CONTRAST TECHNIQUE: Contiguous axial images were obtained from the base of the skull through the vertex without intravenous contrast. COMPARISON:  No pertinent prior exams available for comparison. FINDINGS: Brain: Cerebral volume is Gregg for age. There is no acute intracranial hemorrhage. No demarcated cortical infarct. No extra-axial fluid collection. No evidence of an intracranial mass. No midline shift. Vascular: No hyperdense vessel.  Atherosclerotic calcifications. Skull: Gregg. Negative for fracture or focal lesion. Sinuses/Orbits: Visualized orbits show no acute finding. Mild left sphenoid sinus mucosal thickening. Other: Trace fluid within the right mastoid air cells. IMPRESSION: No evidence of acute intracranial abnormality. Mild left sphenoid sinus mucosal thickening. Trace right mastoid effusion. Electronically Signed   By: Kellie Simmering DO   On: 08/19/2020 12:57   DG Chest Port 1 View  Result Date: 08/20/2020 CLINICAL DATA:  Dyspnea EXAM: PORTABLE CHEST 1 VIEW COMPARISON:  08/14/2020 FINDINGS: Chronic right pleural thickening and basilar plaque formation as well as mild associated relative right-sided volume loss is again identified. Superimposed developing infiltrate is seen throughout the right lung, possibly infectious in the appropriate clinical setting. Left lung is clear. No pneumothorax or pleural effusion.  Cardiomegaly is stable. Pulmonary vascularity  is Gregg. No acute bone abnormality. IMPRESSION: Interval development of a diffuse mild pulmonary infiltrate throughout the right lung, possibly infectious in the appropriate clinical setting. Stable changes of pleural thickening, plaque formation, and mild right-sided volume loss. Stable cardiomegaly Electronically Signed   By: Fidela Salisbury MD   On: 08/20/2020 02:34   CT BONE MARROW BIOPSY & ASPIRATION  Result Date: 08/18/2020 INDICATION: 65 year old male with possible myelodysplastic syndrome referred for biopsy EXAM: CT BONE MARROW BIOPSY AND ASPIRATION MEDICATIONS: None. ANESTHESIA/SEDATION: Moderate (conscious) sedation was employed during this procedure. A total of Versed 0 mg and Fentanyl 25 mcg was administered intravenously. Moderate Sedation Time: None minutes. The patient's level of consciousness and vital signs were monitored continuously by radiology nursing throughout the procedure under my direct supervision. FLUOROSCOPY TIME:  CT COMPLICATIONS: None PROCEDURE: Informed written consent was obtained from the patient after a thorough discussion of the procedural risks, benefits and alternatives. All questions were addressed. Maximal Sterile Barrier Technique was utilized including caps, mask, sterile gowns, sterile gloves, sterile drape, hand hygiene and skin antiseptic. A timeout was performed prior to the initiation of the procedure. The procedure risks, benefits, and alternatives were explained to the patient. Questions regarding the procedure were encouraged and answered. The patient understands and consents to the procedure. Scout CT of the pelvis was performed for surgical planning purposes. The right posterior pelvis was prepped with Chlorhexidine in a sterile fashion, and a sterile drape was applied covering the operative field. A sterile gown and sterile gloves were used for the procedure. Local anesthesia was provided with 1% Lidocaine.  Posterior iliac bone was targeted for biopsy. The skin and subcutaneous tissues were infiltrated with 1% lidocaine without epinephrine. A small stab incision was made with an 11 blade scalpel, and an 11 gauge Murphy needle was advanced with CT guidance to the posterior cortex. Manual forced was used to advance the needle through the posterior cortex and the stylet was removed. A bone marrow aspirate was retrieved and passed to a cytotechnologist in the room. The Murphy needle was then advanced without the stylet for a core biopsy. The core biopsy was retrieved and also passed to a cytotechnologist. Manual pressure was used for hemostasis and a sterile dressing was placed. No complications were encountered no significant blood loss was encountered. Patient tolerated the procedure well and remained hemodynamically stable throughout. IMPRESSION: Status post CT-guided bone marrow biopsy, with tissue specimen sent to pathology for complete histopathologic analysis Signed, Dulcy Fanny. Earleen Newport, DO [facility] Radiology Electronically Signed   By: Corrie Mckusick D.O.   On: 08/18/2020 11:46        Scheduled Meds:  aspirin EC  81 mg Oral Daily   carvedilol  25 mg Oral BID WC   Chlorhexidine Gluconate Cloth  6 each Topical Daily   cyanocobalamin  1,000 mcg Intramuscular Daily   docusate sodium  100 mg Oral Daily   insulin aspart  0-5 Units Subcutaneous QHS   insulin aspart  0-9 Units Subcutaneous TID WC   lactulose  20 g Oral BID   lidocaine  1 patch Transdermal Q24H   mouth rinse  15 mL Mouth Rinse BID   mometasone-formoterol  2 puff Inhalation BID   pantoprazole (PROTONIX) IV  40 mg Intravenous Q24H   rifaximin  550 mg Oral BID   spironolactone  25 mg Oral Daily   Continuous Infusions:  amiodarone 30 mg/hr (08/20/20 0700)     LOS: 5 days  Time spent: 45 minutes    Irine Seal, MD [facility]   To contact the attending provider  between 7A-7P or the covering provider during after hours 7P-7A, please log into the web site [email or web address] and access using universal [place] password for that web site. If you do not have the password, please call the hospital operator.  08/20/2020, 7:47 AM

## 2020-08-20 NOTE — Transfer of Care (Signed)
Immediate Anesthesia Transfer of Care Note  Patient: Gregg Williams  Procedure(s) Performed: CARDIOVERSION  Patient Location: ICU  Anesthesia Type:General  Level of Consciousness: awake  Airway & Oxygen Therapy: Patient Spontanous Breathing and Patient connected to nasal cannula oxygen  Post-op Assessment: Report given to RN and Post -op Vital signs reviewed and stable  Post vital signs: Reviewed  Last Vitals:  Vitals Value Taken Time  BP 80/44 08/20/20 0817  Temp    Pulse 57 08/20/20 0820  Resp 20 08/20/20 0820  SpO2 100 % 08/20/20 0820  Vitals shown include unvalidated device data.  Last Pain:  Vitals:   08/20/20 0215  TempSrc:   PainSc: 0-No pain         Complications: No notable events documented.

## 2020-08-25 ENCOUNTER — Encounter (HOSPITAL_COMMUNITY): Payer: Self-pay

## 2021-01-27 DEATH — deceased

## 2021-08-24 IMAGING — CT CT BIOPSY AND ASPIRATION BONE MARROW
1 series · 12 of 14 positions shown, 15 images · non-contrast
Comparison: none

INDICATION: 64-year-old male with possible myelodysplastic syndrome referred for
biopsy

[Series 4: i-spiral 5.0 b30f · axial · 0.98mm/px · z∈[+1224,+1354]mm · 12 of 45 slices shown, 15 images]
[im 4/45  soft-tissue]
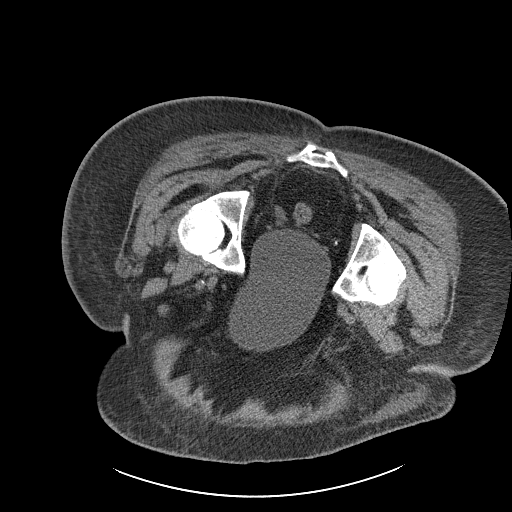
[im 4/45  bone]
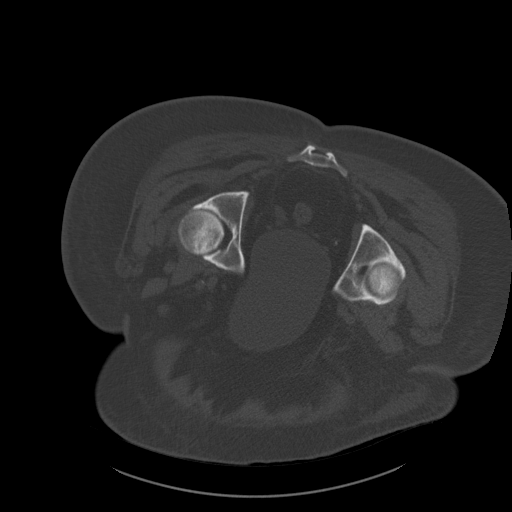
[im 7/45  bone]
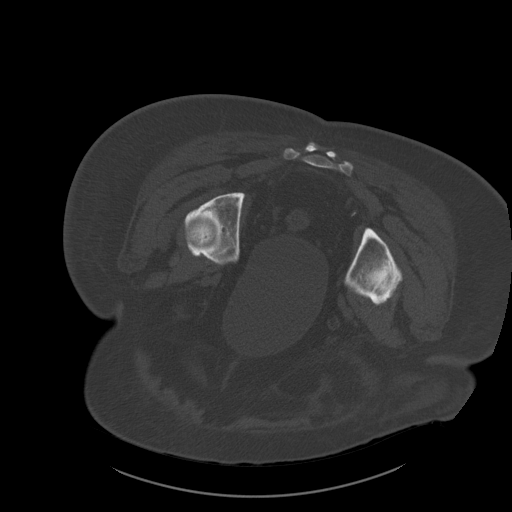
[im 11/45  bone]
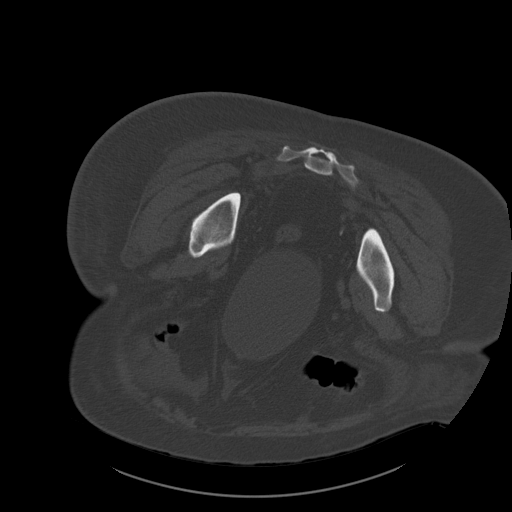
[im 14/45  bone]
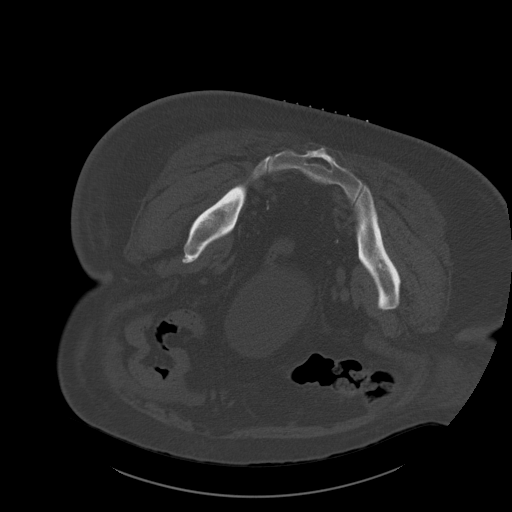
[im 17/45  soft-tissue]
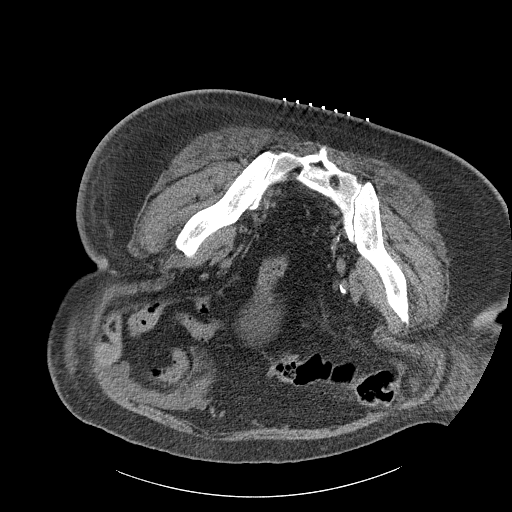
[im 17/45  bone]
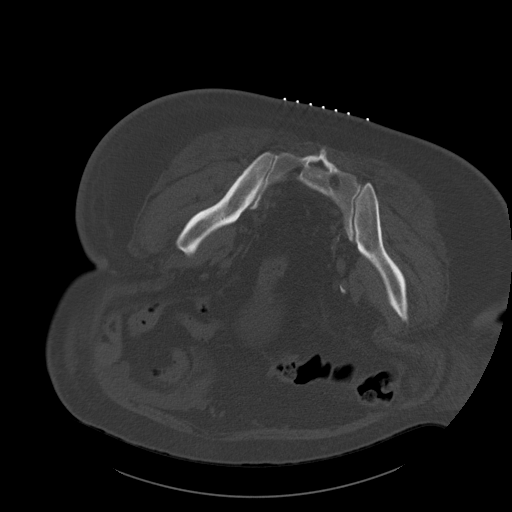
[im 21/45  bone]
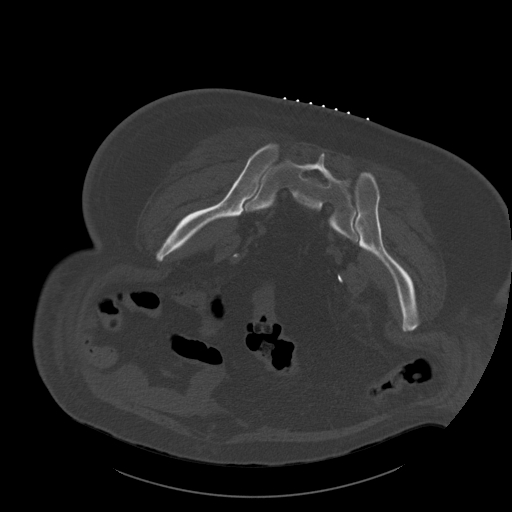
[im 24/45  bone]
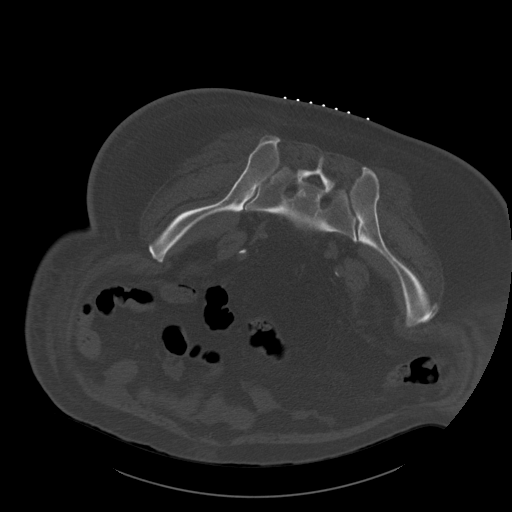
[im 28/45  bone]
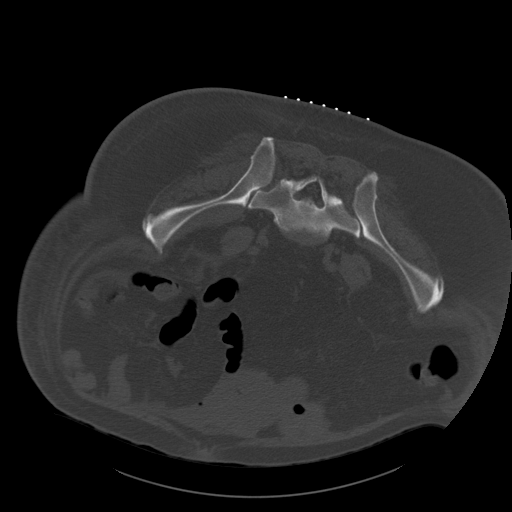
[im 31/45  soft-tissue]
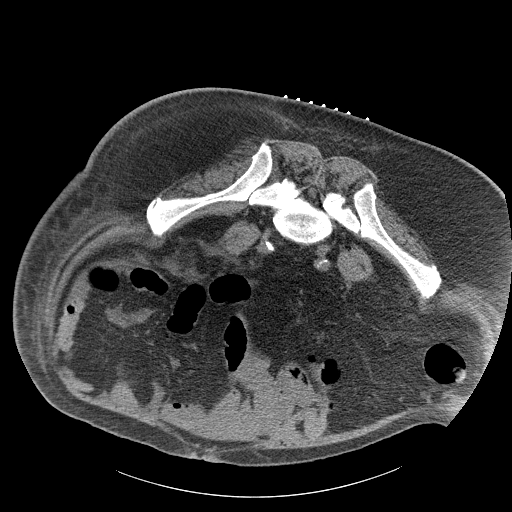
[im 31/45  bone]
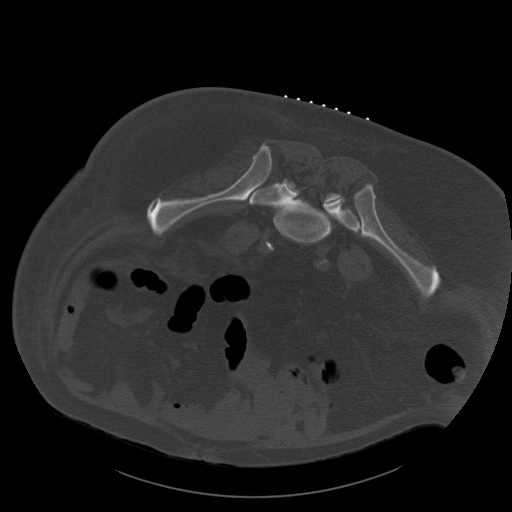
[im 34/45  bone]
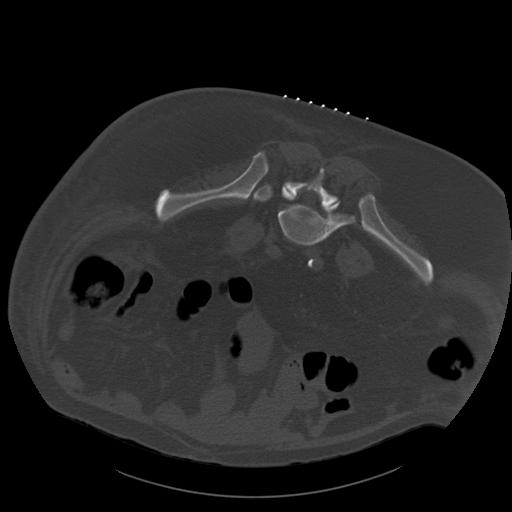
[im 38/45  bone]
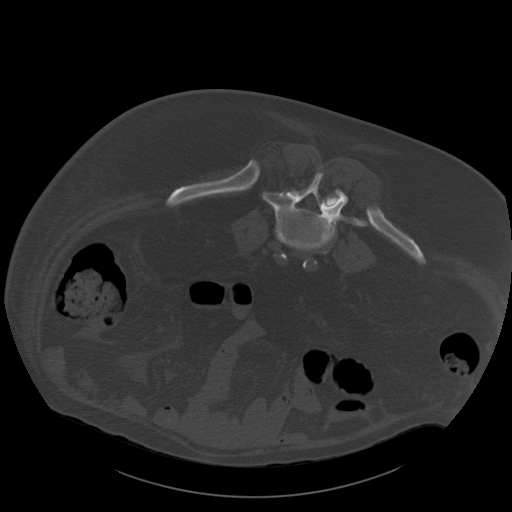
[im 41/45  bone]
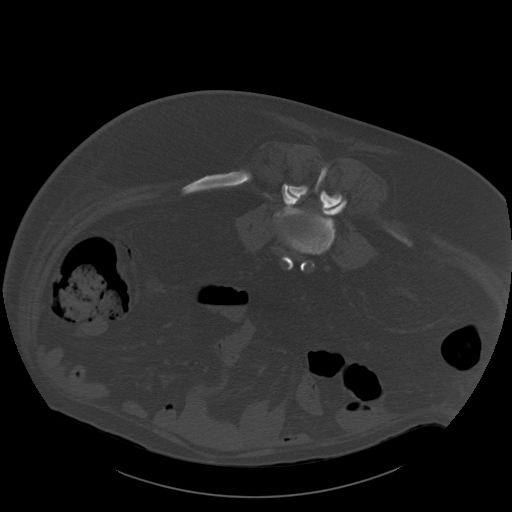

[12 of 14 positions shown; findings below may reference images not displayed]

EXAM:
CT BONE MARROW BIOPSY AND ASPIRATION

MEDICATIONS:
None.

ANESTHESIA/SEDATION:
Moderate (conscious) sedation was employed during this procedure. A
total of Versed 0 mg and Fentanyl 25 mcg was administered
intravenously.

Moderate Sedation Time: None minutes. The patient's level of
consciousness and vital signs were monitored continuously by
radiology nursing throughout the procedure under my direct
supervision.

FLUOROSCOPY TIME:  CT

COMPLICATIONS:
None

PROCEDURE:
Informed written consent was obtained from the patient after a
thorough discussion of the procedural risks, benefits and
alternatives. All questions were addressed. Maximal Sterile Barrier
Technique was utilized including caps, mask, sterile gowns, sterile
gloves, sterile drape, hand hygiene and skin antiseptic. A timeout
was performed prior to the initiation of the procedure.

The procedure risks, benefits, and alternatives were explained to
the patient. Questions regarding the procedure were encouraged and
answered. The patient understands and consents to the procedure.

Scout CT of the pelvis was performed for surgical planning purposes.

The right posterior pelvis was prepped with Chlorhexidine in a
sterile fashion, and a sterile drape was applied covering the
operative field. A sterile gown and sterile gloves were used for the
procedure. Local anesthesia was provided with 1% Lidocaine.

Posterior iliac bone was targeted for biopsy. The skin and
subcutaneous tissues were infiltrated with 1% lidocaine without
epinephrine. A small stab incision was made with an 11 blade
scalpel, and an 11 gauge Onelia needle was advanced with CT guidance
to the posterior cortex. Manual forced was used to advance the
needle through the posterior cortex and the stylet was removed. A
bone marrow aspirate was retrieved and passed to a cytotechnologist
in the room. The Onelia needle was then advanced without the stylet
for a core biopsy. The core biopsy was retrieved and also passed to
a cytotechnologist.

Manual pressure was used for hemostasis and a sterile dressing was
placed.

No complications were encountered no significant blood loss was
encountered.

Patient tolerated the procedure well and remained hemodynamically
stable throughout.
IMPRESSION: Status post CT-guided bone marrow biopsy, with tissue specimen sent
to pathology for complete histopathologic analysis
